# Patient Record
Sex: Female | Born: 1995 | Race: White | Hispanic: No | Marital: Married | Smoking: Never smoker
Health system: Western US, Academic
[De-identification: ages and names within clinical notes are randomized; demographics above are authoritative.]

## PROBLEM LIST (undated history)

## (undated) DIAGNOSIS — H469 Unspecified optic neuritis: Secondary | ICD-10-CM

## (undated) DIAGNOSIS — G35 Multiple sclerosis: Secondary | ICD-10-CM

## (undated) HISTORY — DX: Multiple sclerosis (CMS-HCC): G35

## (undated) HISTORY — DX: Unspecified optic neuritis: H46.9

---

## 2018-10-18 ENCOUNTER — Telehealth (INDEPENDENT_AMBULATORY_CARE_PROVIDER_SITE_OTHER): Payer: Self-pay

## 2018-10-18 NOTE — Telephone Encounter (Signed)
For all patients calling to schedule, reschedule or confirm their appointment    1. Do you have or have you had in the last 24 hours:            Fever:  No           New cough (not chronic) : No           Shortness of breath: No  *Refer patient to PCP if YES for any flu like symptoms           2. Have you knowingly been exposed to anyone having any, some or all of the symptoms listed above?  No     3. Have you traveled  outside of the [place] in the last 14 days? .  No     If Yes to any of these, schedule and or reschedule existing appointment 14 + days out     4.    Are you experiencing any new urgent Neurological symptoms? N/A   If Yes to # 4, provide symptoms below using .sccsymptoms smart phrase and route to site specific triage.    If no to # 4, document encounter and close.  Do not route.    Scripting if patient schedules, reschedules or keeps appointment:   [facility] takes health risks associated with respiratory illnesses, including influenza and the novel coronavirus, very seriously. As such, we are implementing strict visitor restrictions. No visitors of any age will be permitted to visit our hospitals or clinics until further notice. Request for exceptions may be made by contacting the clinic or unit in which loved ones are receiving care.

## 2018-10-21 ENCOUNTER — Encounter (INDEPENDENT_AMBULATORY_CARE_PROVIDER_SITE_OTHER): Payer: Self-pay | Admitting: Hospital

## 2018-10-21 ENCOUNTER — Telehealth (INDEPENDENT_AMBULATORY_CARE_PROVIDER_SITE_OTHER): Payer: Self-pay | Admitting: Neurology

## 2018-10-21 NOTE — Telephone Encounter (Signed)
RN telephoned patient to offer telemedicine visit with Dr. Luiz Blare for patients appointment scheduled 11/03/2018 at 9:45 AM.       Patient is in agreement with Telemedicine appointment. Patient appointment type changed to telemedicine visit.  Patient telemedicine visit instructions sent via MyChart.

## 2018-11-01 ENCOUNTER — Encounter (INDEPENDENT_AMBULATORY_CARE_PROVIDER_SITE_OTHER): Payer: Self-pay | Admitting: Hospital

## 2018-11-03 ENCOUNTER — Telehealth (INDEPENDENT_AMBULATORY_CARE_PROVIDER_SITE_OTHER): Payer: TRICARE Prime—HMO | Admitting: Neurology

## 2018-11-03 ENCOUNTER — Encounter (INDEPENDENT_AMBULATORY_CARE_PROVIDER_SITE_OTHER): Payer: Self-pay

## 2018-11-03 ENCOUNTER — Encounter (INDEPENDENT_AMBULATORY_CARE_PROVIDER_SITE_OTHER): Payer: Self-pay | Admitting: Hospital

## 2018-11-03 ENCOUNTER — Ambulatory Visit (INDEPENDENT_AMBULATORY_CARE_PROVIDER_SITE_OTHER): Payer: TRICARE Prime—HMO | Admitting: Neurology

## 2018-11-03 ENCOUNTER — Encounter (INDEPENDENT_AMBULATORY_CARE_PROVIDER_SITE_OTHER): Payer: Self-pay | Admitting: Neurology

## 2018-11-03 ENCOUNTER — Telehealth (INDEPENDENT_AMBULATORY_CARE_PROVIDER_SITE_OTHER): Payer: Self-pay | Admitting: Neurology

## 2018-11-03 DIAGNOSIS — G35 Multiple sclerosis: Secondary | ICD-10-CM

## 2018-11-03 DIAGNOSIS — H469 Unspecified optic neuritis: Secondary | ICD-10-CM

## 2018-11-03 NOTE — Patient Instructions (Signed)
RECOMMENDATIONS:  1.  Disease modifying therapy or Diagnostic testing:  Considering Copaxone, Tecfidera, Tysabri or Gilenya.    - we will address Lyme concerns; suspect she had a false positive by description; she has appointment with ID June 8th [place]    2.  Symptom management:  None needed    3.  Physical therapy and/or exercise:  Continue regular exercise    4.  Diet and vitamins:    Take daily vitamin D3  2000   IU  Healthy well balanced diet.      5.  Next MRI due:  Please send CDs so Dr. Luiz Blare can review images    6.  Next labs due:  DMT screening labs    7.  Next vision exam due:  OCT and VEP with next visit    8.  Other testing, recommendations, or consults:    9.  Follow-up call in 2 weeks to decide about medication and follow-up in person 2 months    Education Provided: medication management of MS    Resources for support groups and patient education materials can be found at  Www.nationalmssociety.org     MS Disease Modifying Therapies (DMT)  [email or web address]    Wellness in MS  [email or web address]      Research:    Please find below contact information for our MS team.  Please also be aware, since we are with patients in clinic all day, response times may be affected.    For scheduling, changing, or cancelling appointments, please call [phone] or [phone]    For MS medication questions, please contact or Pharmacist, Dr. Barbie Haggis via MyChart or [phone]    For new symptoms, clinical questions, or other nursing related concerns, please contact:         Dorise Bullion, RN for Dr. Luiz Blare via MyChart or [phone]       Anselmo Rod, RN for Dr. Arline Asp via MyChart or [phone]    If unable to reach me directly you may call the Call Center to get a message to our team at  [phone]     To speak with one of our Research Coordinators with questions pertaining to research studies, please contact Annalise Miner or Verlan Friends at [phone]    As a backup plan only, if you have difficulty reaching [place] through the above mechanisms, you may email Dr. Luiz Blare at [email or web address] for non-urgent matters.  Please note that email is not appropriate for urgent matters and MyChart is a preferred, secure messaging system.

## 2018-11-03 NOTE — Progress Notes (Signed)
MS New Patient Note:    ---------------------(data below generated by Raynaldo Opitz, MD)--------------------    Patient Verification & Telemedicine Consent:    I am proceeding with this evaluation at the direct request of the patient.  I have verified this is the correct patient and have obtained verbal consent and written consent from the patient/ surrogate to perform this voluntary telemedicine evaluation (including obtaining history, performing examination and reviewing data provided by the patient).   The patient/ surrogate has the right to refuse this evaluation.  I have explained risks (including potential loss of confidentiality), benefits, alternatives, and the potential need for subsequent face to face care. Patient/ surrogate understands that there is a risk of medical inaccuracies given that our recommendations will be made based on reported data (and we must therefore assume this information is accurate).  Knowing that there is a risk that this information is not reported accurately, and that the telemedicine video, audio, or data feed may be incomplete, the patient agrees to proceed with evaluation and holds [place] harmless knowing these risks. In this evaluation, we will be providing recommendations only. The patient/ surrogate has been notified that other healthcare professionals (including students, residents and Metallurgist) may be involved in this audio-video evaluation.   All laws concerning confidentiality and patient access to medical records and copies of medical records apply to telemedicine.  The patient/ surrogate has received the [place] Notice of Privacy Practices.  I have reviewed this above verification and consent paragraph with the patient/ surrogate.  If the patient is not capacitated to understand the above, and no surrogate is available, since this is not an emergency evaluation, the visit will be rescheduled until such time that the patient can consent, or the  surrogate is available to consent.    Demographics:   Medical Record #: [id number]   Date: Nov 03, 2018   Patient Name: Jasmine Schneider   DOB: [birth year]  Age: 23 year old  Sex: female  Location: Home address on file    Evaluator(s):   Taela Charbonneau was evaluated by me today.    Clinic Location: [place] ([place])  [place]  [place] [address]          Primary care physician:  [facility]  [address]  [place] [address]    Referring physician  Leane Para, MD  [facility] [address]  [place], [place] [address]    I met with Jasmine Schneider in new patient consultation today at the [facility].  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    Her husband is active duty Therapist, art and she was previously seen at [facility].     First symptoms: April 2020 she had retro-orbital pain behind both eyes that then intensified on right side; then woke up with tinted vision in right eye (right ON).  First diagnosed as cluster headaches.  She ultimately saw ophthalmology and was diagnosed with optic neuritis.  On ROS at that visit she reported having tingling episodes in the past in her limbs.  Brain MRI April 30th showed lesions (3-4 bigger and smaller lesions) and she was diagnosed with MS. May 8th C and T spine MRIs done.  She also has a Chiari malformation. She was treated with Solumedrol  for 5 days without taper.      Relapse today:  No  Most recent relapse date: April 2020    Other prior relapses: None known, but had episodes of tingling in the past around menstrual cycle    Current DMT: none, but [facility] hospital doctor was interested in starting [place] but can't provide FDO.   Prior Medications used:    Current MS related symptoms include: tingling around menstrual cycle, pulling sensation in  mid-back    She denies cognitive changes, fatigue, depression, anxiety, visual changes, hearing loss, tinnitus, vertigo, dysarthria/dysphagia, spasticity, weakness, numbness, incoordination, imbalance, falls, tremor, bowel/bladder dysfunction, headache, L'hermitte's sign, Uthoff's phenomenon.     Walking is unrestricted.   She is trying to exercise despite pandemic but not regular.       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 Family history is otherwise notable for  Father has ITP, MGUS and diabetes type 2  Mom has melanoma    Social history: The patient is married and her husband is active duty in Therapist, art and is not deployed.  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  There is no history of drug or alcohol abuse. She  has no history on file for tobacco.    Medications:  Birth control pill    Allergies: Allergies not on file    Physical examination:  There were no vitals taken for this visit.   Video visit exam    Cardiovascular exam: No lower extremity edema.      On neurological examination, the patient is alert and oriented to person, place and time. Cognition is grossly intact with normal language and short-term memory.      Cranial Nerves:   She identified 6/6 color plates correctly with right eye and 6/6 with the left.    Alignment was orthophoric and extraocular movements were intact without nystagmus. Facial sensation was intact to light touch, in all three branches of the trigeminal nerve bilaterally. Facial strength was full and symmetric.  Hearing was grossly intact. The trapezius and sternocleidomastoid strength is full bilaterally. The tongue was midline and rapid alternating movements of the tongue were normal.       Motor Exam:   Pronator Drift: absent   Bulk: normal bulk   Tone: normal tone in all limbs   Fine finger movements were fast and symmetric.   Foot taps were fast and symmetric.       Coordination and Gait:  There is normal finger-nose-finger, heel-knee-shin and rapid alternating movements. There is no dysdiadochokinesia. The gait is normal, narrow-based and with normal heel, toe and tandem walk. Romberg sign was negative.       Imaging: The following imaging studies were reviewed in detail with the patient:   MRI Brain 10/05/18 demonstrates Dawson fingers and infratentorial/cerebellar lesions by report    Cervical Spine MRI in [facility] hospital records report indicates a lesion at C5      IMPRESSION:  As I discussed with the patient, Jasmine Schneider had acute  right ON and imaging consistent with RRMS (by report). We reviewed in detail treatment options for in light of her course thus far, age and the pandemic.       RECOMMENDATIONS:  1.  Disease modifying therapy or Diagnostic testing:  Considering Copaxone, Tecfidera, Tysabri or Gilenya.    - we will address Lyme concerns; suspect she had a false positive by description; she has appointment with ID June 8th [place]    2.  Symptom management:  None needed    3.  Physical therapy and/or exercise:  Continue regular exercise    4.  Diet and vitamins:    Take daily vitamin D3  2000   IU  Healthy well balanced diet.      5.  Next MRI due:  Please send CDs so Dr. Berenice Primas can review images  Repeat MRIs 6 months after starting new medication    6.  Next labs due:  DMT screening labs    7.  Next vision exam due:  OCT and VEP with next visit    8.  Other testing, recommendations, or consults:    9.  Follow-up call in 2 weeks to decide about medication and follow-up in person 2 months    Education Provided:  medication management of MS; covid-19 and MS    Resources for support groups and patient education materials can be found at  [email or web address]     MS Disease Modifying Therapies (DMT)  [email or web address]    Wellness in [place]  [email or web address]      Research:    Please find below contact information for our MS team.  Please also be aware, since we are with patients in clinic all day, response times may be affected.    For scheduling, changing, or cancelling appointments, please call [phone] or [phone]    For MS medication questions, please contact or Pharmacist, Dr. Bosie Clos via [place] or [phone]    For new symptoms, clinical questions, or other nursing related concerns, please contact:         Prudencio Pair, RN for Dr. Berenice Primas via MyChart or [phone]       Sheran Lawless, RN for Dr. Rosendo Gros via [place] or [phone]    If unable to reach me directly you may call the Call Center to get a message to our team at [phone]     To speak with one of our Research Coordinators with questions pertaining to research studies, please contact [place] or Alda Berthold at [phone]    As a backup plan only, if you have difficulty reaching [place] through the above mechanisms, you may email Dr. Berenice Primas at [email or web address] for non-urgent matters.  Please note that email is not appropriate for urgent matters and MyChart is a preferred, secure messaging system.        This encounter included time spent in the following categories:    Pre-visit: 10 minutes reviewing scanned documents from the [facility], Care Everywhere, and recent diagnostic tests, MRI reports.  Intra-Visit: 45 minutes updating relevant history, performing a video-based physical exam, making a diagnosis, creating and  discussing a treatment plan.  and  Post-Visit: 15 minutes completing the note, placing orders, and coordinating care.  The total duration of this encounter including the Pre-Visit, Intra-Visit, and Post-Visit times, excluding separately reportable services/procedures was:  70 minutes

## 2018-11-03 NOTE — Telephone Encounter (Signed)
Placed a call to pt to schedule for a 2 weeks f/u appt as recommended in today's  [facility] appt  however, provider's schedule is blocked first 2 weeks of June. In the meantime, pt was scheduled at the earliest appt date found which is 12/01/18 @ 1:15 PM via [facility] appt. Also pt stated she will mail CD to the clinic re: imaging results. Provided pt clinic address.

## 2018-11-07 ENCOUNTER — Telehealth (INDEPENDENT_AMBULATORY_CARE_PROVIDER_SITE_OTHER): Payer: Self-pay | Admitting: Neurology

## 2018-11-07 NOTE — Telephone Encounter (Signed)
RN telephoned patient to follow up p[er Dr. Luiz Blare request below about DMT.  Patient stated "she didn't want to appear rude or offensive, but Dr. Luiz Blare hasn't even seen my MRI's and rest of my testing yet. I'm following up with Infectious Disease June 8th re: my Lyme Disease for additional testing also".  Patient told RN that "until she is 100% certain she has MS she does not want to be on a DMT. She doesn't want to medication unless she has to and this is a big decision".      When asked about imaging, patient stated she would bring in medical records and imaging discs to clinic, wait for them to be uploaded and then take them home with her after her visit with ID.      RN will convey message to Dr. Christian Mate, Alden Server, MD  Adriana Simas, RN            Video on 25th fine for me, but maybe ask patient is she had made decision about DMT meds or had a change to talk to [facility].    Previous Messages      ----- Message -----   From: Adriana Simas, RN   Sent: 11/07/2018 11:05 AM PDT   To: Briscoe Burns, MD   Subject: Telephone F/Up in 2 weeks               Patient has return video visit 6/25. Do you also want me to schedule a phone consult?     ----- Message -----   From: Briscoe Burns, MD   Sent: 11/03/2018 10:42 AM PDT   To: Adriana Simas, RN     I will need assistance scheduling this patient for a telephone follow-up in 2 weeks. We will try to use new "office hours" to ease scheduling. Thanks

## 2018-11-14 ENCOUNTER — Other Ambulatory Visit: Payer: Self-pay

## 2018-11-14 ENCOUNTER — Telehealth (INDEPENDENT_AMBULATORY_CARE_PROVIDER_SITE_OTHER): Payer: Self-pay | Admitting: Neurology

## 2018-11-14 NOTE — Telephone Encounter (Signed)
CD has been successfully uploaded via [place] CD will be mailed back to patient at address on file

## 2018-11-14 NOTE — Telephone Encounter (Signed)
Hello,    Pt stopped by at [facility] to drop off disc for upload; All images and reports from [facility]. Disc placed in blue folder.    Pls mail back to pt's address (confirmed)    Thanks,

## 2018-11-28 ENCOUNTER — Telehealth (INDEPENDENT_AMBULATORY_CARE_PROVIDER_SITE_OTHER): Payer: Self-pay | Admitting: Neurology

## 2018-11-28 NOTE — Telephone Encounter (Signed)
Placed a call to pt, reminder her of upcoming [place] appt on 12/01/18 @ 1:15 PM with Dr. Berenice Primas. Reviewed AVS recommendations from [place] on 11/03/18, per pt, she has no questions or concerns at this time.

## 2018-11-30 ENCOUNTER — Encounter (INDEPENDENT_AMBULATORY_CARE_PROVIDER_SITE_OTHER): Payer: Self-pay | Admitting: Hospital

## 2018-11-30 ENCOUNTER — Encounter (HOSPITAL_BASED_OUTPATIENT_CLINIC_OR_DEPARTMENT_OTHER): Payer: Self-pay | Admitting: Hospital

## 2018-12-01 ENCOUNTER — Telehealth (INDEPENDENT_AMBULATORY_CARE_PROVIDER_SITE_OTHER): Payer: Self-pay | Admitting: Neurology

## 2018-12-01 ENCOUNTER — Telehealth (INDEPENDENT_AMBULATORY_CARE_PROVIDER_SITE_OTHER): Payer: TRICARE Prime—HMO | Admitting: Neurology

## 2018-12-01 ENCOUNTER — Encounter (INDEPENDENT_AMBULATORY_CARE_PROVIDER_SITE_OTHER): Payer: Self-pay | Admitting: Neurology

## 2018-12-01 DIAGNOSIS — G35 Multiple sclerosis: Secondary | ICD-10-CM

## 2018-12-01 DIAGNOSIS — R5383 Other fatigue: Secondary | ICD-10-CM

## 2018-12-01 MED ORDER — AMANTADINE HCL 100 MG OR TABS
100.0000 mg | ORAL_TABLET | Freq: Two times a day (BID) | ORAL | 0 refills | Status: DC
Start: 2018-12-01 — End: 2019-01-26

## 2018-12-01 NOTE — Telephone Encounter (Signed)
Placed a call to pt, reviewed today's AVS recommendations. Per pt, she has no questions at this time.    Additionally, I scheduled pt's recommended 3 months in-person visit with Dr. Berenice Primas on 03/09/19 at 11:30 AM. Appt reminder printed and will mail to address on file, verified by pt.

## 2018-12-01 NOTE — Progress Notes (Signed)
MS Follow-up Note:  LV 11/03/18    ---------------------(data below generated by Jasmine Opitz, MD)--------------------    Patient Verification & Telemedicine Consent:    I am proceeding with this evaluation at the direct request of the patient.  I have verified this is the correct patient and have obtained verbal consent and written consent from the patient/ surrogate to perform this voluntary telemedicine evaluation (including obtaining history, performing examination and reviewing data provided by the patient).   The patient/ surrogate has the right to refuse this evaluation.  I have explained risks (including potential loss of confidentiality), benefits, alternatives, and the potential need for subsequent face to face care. Patient/ surrogate understands that there is a risk of medical inaccuracies given that our recommendations will be made based on reported data (and we must therefore assume this information is accurate).  Knowing that there is a risk that this information is not reported accurately, and that the telemedicine video, audio, or data feed may be incomplete, the patient agrees to proceed with evaluation and holds [place] harmless knowing these risks. In this evaluation, we will be providing recommendations only. The patient/ surrogate has been notified that other healthcare professionals (including students, residents and Metallurgist) may be involved in this audio-video evaluation.   All laws concerning confidentiality and patient access to medical records and copies of medical records apply to telemedicine.  The patient/ surrogate has received the [place] Notice of Privacy Practices.  I have reviewed this above verification and consent paragraph with the patient/ surrogate.  If the patient is not capacitated to understand the above, and no surrogate is available, since this is not an emergency evaluation, the visit will be rescheduled until such time that the patient can consent, or  the surrogate is available to consent.    Demographics:   Medical Record #: [id number]    Date: December 01, 2018   Patient Name: Jasmine Schneider   DOB: 1996/04/18  Age: 23 year old  Sex: female  Location: Home address on file    Evaluator(s):   Jasmine Schneider was evaluated by me today.    Clinic Location: [place] ([place])  [place]  [place] [address]      Primary care physician:  Jasmine Schneider  [address]  [place] [address]    Referring physician  Jasmine Para, MD  [facility] [address]  [place], [place] [address]    I met with Jasmine Schneider in new patient consultation today at the [facility].  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    Her husband is active duty Therapist, art and she was previously seen at [facility].     Interval history:  - Repeat Lyme test was negative  - Goal for today is to pick MS medication and address fatigue - napping two hours a day and sleeping long overnight as well.   - Planning a pregnancy in 3 years      First symptoms: April 2020 she had retro-orbital pain behind both eyes that then intensified on right side; then woke up with tinted vision in right eye (right ON).  First diagnosed as cluster headaches.  She ultimately saw ophthalmology and was diagnosed with optic neuritis.  On ROS at that visit she reported  having tingling episodes in the past in her limbs.  Brain MRI April 30th showed lesions (3-4 bigger and smaller lesions) and she was diagnosed with MS. May 8th C and T spine MRIs done.  She also has a Chiari malformation. She was treated with Solumedrol for 5 days without taper.      Relapse today:  No  Most recent relapse date: April 2020    Other prior relapses: None known, but had episodes of tingling in the past around menstrual cycle    Current DMT:  none, but [facility] Schneider doctor was interested in starting [place] but can't provide FDO.   Prior Medications used:    Current MS related symptoms include: tingling around menstrual cycle, pulling sensation in mid-back    She denies cognitive changes, fatigue, depression, anxiety, visual changes, hearing loss, tinnitus, vertigo, dysarthria/dysphagia, spasticity, weakness, numbness, incoordination, imbalance, falls, tremor, bowel/bladder dysfunction, headache, L'hermitte's sign, Uthoff's phenomenon.     Walking is unrestricted.   She is trying to exercise despite pandemic but not regular.       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 Family history is otherwise notable for  Father has ITP, MGUS and diabetes type 2  Mom has melanoma    Social history: The patient is married and her husband is active duty in Therapist, art and is not deployed.  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  There is no history of drug or alcohol abuse. She  reports that she has never smoked. She has never used smokeless tobacco.    Medications:  Birth control pill    Allergies: Not on File    Physical examination:  There were no vitals taken for this visit.   Video visit exam    Cardiovascular exam: No lower extremity edema.      On neurological examination, the patient is alert and oriented to person, place and time. Cognition is grossly intact with normal language and short-term memory.      Cranial Nerves:   She identified 6/6 color plates correctly with right eye and 6/6 with the  left.    Alignment was orthophoric and extraocular movements were intact without nystagmus. Facial sensation was intact to light touch, in all three branches of the trigeminal nerve bilaterally. Facial Schneider was full and symmetric. Hearing was grossly intact. The trapezius and sternocleidomastoid Schneider is full bilaterally. The tongue was midline and rapid alternating movements of the tongue were normal.       Motor Exam:   Pronator Drift: absent   Bulk: normal bulk   Tone: normal tone in all limbs   Fine finger movements were fast and symmetric.   Foot taps were fast and symmetric.       Coordination and Gait:  There is normal finger-nose-finger, heel-knee-shin and rapid alternating movements. There is no dysdiadochokinesia. The gait is normal, narrow-based and with normal heel, toe and tandem walk. Romberg sign was negative.       Imaging: The following imaging studies were reviewed  in detail with the patient (images available for my review today)  MRI Brain 10/05/18 demonstrates Dawson fingers and infratentorial/cerebellar lesions by report and my own review today. >10 lesions consistent in appearance and location with MS    Cervical Spine and Thoracic MRI in [facility] records report indicates a lesion at C5; by my review also lesion C2-4 and likely partial cord lesion mid-thoracic cord.           IMPRESSION:  As I discussed with the patient, Ms. Linskey had acute right ON and imaging consistent with RRMS (by report). We reviewed in detail treatment options for in light of her course thus far, age and the pandemic. We reviewed several medications but she favors the S1P drugs.        RECOMMENDATIONS:  1.  Disease modifying therapy or Diagnostic testing:  S1P receptor drug - fingolimod, siponimod or ozanimod   She will review pros and cons with pharmacist and will also determine what is compatible with her insurance.     2.  Symptom management:  Amantadine 100 mg twice daily for fatigue - needs assistance  getting script to [facility]    3.  Physical therapy and/or exercise:  Continue regular exercise    4.  Diet and vitamins:    Take daily vitamin D3  2000   IU  Healthy well balanced diet.      5.  Next MRI due:  Repeat MRIs 6 months after starting new medication    6.  Next labs due:  1 month after starting drug    7.  Next vision exam due:  OCT and VEP with next visit    8.  Other testing, recommendations, or consults:    9.  Follow-up in person in 3 months    Education Provided: medication management of MS; covid-19 and MS    Resources for support groups and patient education materials can be found at  [email or web address]     MS Disease Modifying Therapies (DMT)  [email or web address]    Wellness in [place]  [email or web address]      Research:    Please find below contact information for our MS team.  Please also be aware, since we are with patients in clinic all day, response times may be affected.    For scheduling, changing, or cancelling appointments, please call [phone] or [phone]    For MS medication questions, please contact or Pharmacist, Dr. Bosie Clos via [place] or [phone]    For new symptoms, clinical questions, or other nursing related concerns, please contact:         Prudencio Pair, RN for Dr. Berenice Primas via MyChart or [phone]       Sheran Lawless, RN for Dr. Rosendo Gros via [place] or [phone]    If unable to reach me directly you may call the Call Center to get a message to our team at [phone]     To speak with one of our Research Coordinators with questions pertaining to research studies, please contact [place] or Alda Berthold at [phone]    As a backup plan only, if you have difficulty reaching [place] through the above mechanisms, you may email Dr. Berenice Primas at  [email or web address] for non-urgent matters.  Please note that email is not appropriate for urgent matters and MyChart is a preferred, secure messaging system.        This encounter included time spent in the following categories:    Pre-visit: 10 minutes reviewing uploaded MRI images and notes from  last visit  Intra-Visit: 30 minutes updating relevant history, performing a video-based physical exam, making a diagnosis, creating and discussing a treatment plan.  and  Post-Visit: 10 minutes completing the note, placing orders, and coordinating care.  The total duration of this encounter including the Pre-Visit, Intra-Visit, and Post-Visit times, excluding separately reportable services/procedures was:  50 minutes

## 2018-12-01 NOTE — Telephone Encounter (Signed)
RN faxed Amantadine order to [facility] per Dr. Berenice Primas request.    Received: Today   Message Contents   Berenice Primas Brantley Stage, MD  Alvino Blood, RN            Please help patient get amantadine script to [facility] - I can't eprescribe there

## 2018-12-02 ENCOUNTER — Telehealth (INDEPENDENT_AMBULATORY_CARE_PROVIDER_SITE_OTHER): Payer: Self-pay | Admitting: Student in an Organized Health Care Education/Training Program

## 2018-12-02 DIAGNOSIS — G35 Multiple sclerosis: Secondary | ICD-10-CM

## 2018-12-02 MED ORDER — FINGOLIMOD HCL 0.5 MG PO CAPS
0.5000 mg | ORAL_CAPSULE | Freq: Every day | ORAL | 5 refills | Status: DC
Start: 2018-12-02 — End: 2019-01-26
  Filled 2018-12-02 – 2019-01-25 (×2): qty 30, 30d supply, fill #0

## 2018-12-02 NOTE — Telephone Encounter (Signed)
[facility] Initial Prior Authorization Request    Discussed various S1P options for treatment of MS with Jasmine Schneider and she would like to pursue Gilenya.    Baseline:  (within 6 months)  CBC wnl 10/05/18  Varicella Zoster IGG Ab- positive 10/14/18  LFTs wnl 10/05/18  OCT- reviewed outside labs "slight paracentral thinning OU, symmetric" 10/05/18  EKG- states recently done at [facility], asking her to have actual report faxed to office for review        PLAN:  --Will await EKG report and then send in start form  --Will request Gilenya at home via manufacturer for FDO  --Order sent to [place] to work on authorization

## 2018-12-05 ENCOUNTER — Other Ambulatory Visit: Payer: Self-pay

## 2018-12-05 NOTE — Telephone Encounter (Signed)
[facility] Initial Prior Authorization Request    Received notification that patient requires prior authorization for Gilenya 0.5mg  once daily, #30/30 days     Insurance ID: [id number]     PLAN:  --Submitted PA to [facility] via fax

## 2018-12-06 ENCOUNTER — Other Ambulatory Visit: Payer: Self-pay

## 2018-12-06 NOTE — Telephone Encounter (Signed)
[facility] Prior Authorization Status     PA for Gilenya 0.5mg  once daily, #30/30 days    Approved        Dates: Effective unitl 06/07/2098    PA #: [id number]    Specialty pharmacy: [facility]    PLAN:  --Prescription needs to be entered, routed to [facility]

## 2018-12-07 NOTE — Telephone Encounter (Signed)
Notified Jasmine Schneider of approval.    Awaiting EKG from [facility].- She will follow up with them again.    Will send in start form once confirmed.    Sent instructions on how she can e-consent for services [email or web address]

## 2018-12-12 ENCOUNTER — Other Ambulatory Visit: Payer: Self-pay

## 2018-12-20 ENCOUNTER — Encounter (INDEPENDENT_AMBULATORY_CARE_PROVIDER_SITE_OTHER): Payer: Self-pay | Admitting: Neurology

## 2018-12-20 NOTE — Telephone Encounter (Signed)
Error, please disregard.

## 2018-12-23 NOTE — Telephone Encounter (Signed)
Patient was told she has to make a formal records request, which she did.     [place] at [phone]    Number for Neurology: [phone]. Dr. Shawn Stall is out for the next 14 days but     "normal sinus, borderline right axis deviations"    Per Dr. Berenice Primas, we will need to see actual ECG or note from Dr. Shawn Stall or cardiologist that states it was a normal ECG (as this sounds like the machine read)    Notified [place] of status via [place]

## 2018-12-30 ENCOUNTER — Telehealth (INDEPENDENT_AMBULATORY_CARE_PROVIDER_SITE_OTHER): Payer: Self-pay | Admitting: Neurology

## 2018-12-30 NOTE — Telephone Encounter (Signed)
EKG scanned into media

## 2019-01-03 ENCOUNTER — Encounter (INDEPENDENT_AMBULATORY_CARE_PROVIDER_SITE_OTHER): Payer: Self-pay | Admitting: Student in an Organized Health Care Education/Training Program

## 2019-01-03 DIAGNOSIS — R5383 Other fatigue: Secondary | ICD-10-CM

## 2019-01-03 DIAGNOSIS — G35 Multiple sclerosis: Secondary | ICD-10-CM

## 2019-01-04 ENCOUNTER — Telehealth (INDEPENDENT_AMBULATORY_CARE_PROVIDER_SITE_OTHER): Payer: Self-pay | Admitting: Neurology

## 2019-01-04 NOTE — Telephone Encounter (Signed)
Received enrollment form confirmation via fax from [place] scanned to media.    Routing as FYI.

## 2019-01-25 ENCOUNTER — Other Ambulatory Visit: Payer: Self-pay

## 2019-01-25 ENCOUNTER — Telehealth (INDEPENDENT_AMBULATORY_CARE_PROVIDER_SITE_OTHER): Payer: Self-pay | Admitting: Neurology

## 2019-01-25 DIAGNOSIS — G35 Multiple sclerosis: Secondary | ICD-10-CM

## 2019-01-25 NOTE — Telephone Encounter (Signed)
Received reports via fax from [facility] regarding pt's [place] treatment report and pre/post EKG report.    Documents scanned into Environmental health practitioner.

## 2019-01-26 ENCOUNTER — Encounter (HOSPITAL_BASED_OUTPATIENT_CLINIC_OR_DEPARTMENT_OTHER): Payer: Self-pay | Admitting: Hospital

## 2019-01-26 ENCOUNTER — Encounter (INDEPENDENT_AMBULATORY_CARE_PROVIDER_SITE_OTHER): Payer: Self-pay | Admitting: Neurology

## 2019-01-26 MED ORDER — GILENYA 0.5 MG PO CAPS
0.50 mg | ORAL_CAPSULE | Freq: Every day | ORAL | 5 refills | Status: DC
Start: 2019-01-26 — End: 2020-01-16

## 2019-01-26 MED ORDER — AMANTADINE HCL 100 MG OR TABS
100.00 mg | ORAL_TABLET | Freq: Two times a day (BID) | ORAL | 0 refills | Status: AC
Start: 2019-01-26 — End: ?

## 2019-01-26 NOTE — Telephone Encounter (Signed)
From: Jasmine Schneider  To: Raynaldo Opitz, MD  Sent: 01/26/2019 3:08 PM PDT  Subject: 1-Non Urgent Medical Advice    Hello, was wondering if you will do a JC virus test on me, or if that's something that my neurologist would do! thanks   Jasmine Schneider

## 2019-01-26 NOTE — Telephone Encounter (Signed)
FDO completed on 01/21/19.  FDO report scanned into media.  FDO report reviewed - NSR (pre/post), HR 63 (pre)/66 (post), QT 390 (pre)/382(post), no AV block (pre/post).  No symptoms reported during FDO and HR/BP stable throughout.     Spoke to patient, she reports tolerating the FDO well.   She did experience 2 episodes of "fluttering" in her chest that lasted only a few seconds and has not had any more since.  She did not use any pre-workout, have any energy drinks, caffeine, etc. Denied any other symptoms including: shortness of breath, chest pain, lightheadedness, dizziness.      [place] she will bring up the above side effects to him.  Will also route to Dr. Berenice Primas for awareness.      Patient will continue Gilenya and report any additional "flutters" or any new side effects.  Rx entered and sent to [facility].     Of note, patient reports taking pre-workout.  She will send [place] a picture of the ingredients so that we can make sure there aren't any interactions with her Gilenya.      Erik Obey Momodou Consiglio, PHARMD, BCPS, NCTTP

## 2019-02-06 NOTE — Telephone Encounter (Signed)
Patient indicated [place] lab completed and is awaiting results. RN requested office receive a faxed or mail in copy of results from patient.

## 2019-02-07 NOTE — Telephone Encounter (Signed)
Researched ingredients in preworkout.  I cannot find information on everything.  From what I was able to find, there are 2 ingredients that cause the most concern:  evodiamine and rauwolfia vomitoria.  Evodiamine can cause QT prolongation and Rauwolfia vomitoria can cause bradycardia and irregular heartbeats.   Because Gilenya also has the potential to do this, we would be worried about additive effects of the combination.      Because I am unable to find information on all the ingredients and with the 2 concerning ingredients above, I would recommend against using this pre-workout product.      Patient notified via [place] in separate encounter.    Unable to find adequate information on the following:   - Pump and Volume blend (no ingredients listed)  - Citruline  - Energy blend (no ingredients listed)  - Focus and absorption energy blend (no ingredients listed)  - DMAE    Eugen Jeansonne L Sabrea Sankey, PHARMD, BCPS, NCTTP

## 2019-02-16 ENCOUNTER — Telehealth (INDEPENDENT_AMBULATORY_CARE_PROVIDER_SITE_OTHER): Payer: Self-pay | Admitting: Neurology

## 2019-02-16 NOTE — Telephone Encounter (Signed)
Placed a call back to Dr. Macario Golds office (Neurologist) to provide requested clinic fax number: [phone] to fax mutual pt's lab results.

## 2019-02-17 ENCOUNTER — Encounter (HOSPITAL_BASED_OUTPATIENT_CLINIC_OR_DEPARTMENT_OTHER): Payer: Self-pay | Admitting: Student in an Organized Health Care Education/Training Program

## 2019-02-17 NOTE — Telephone Encounter (Signed)
Called Dr. Macario Golds office at [facility] [phone] (option 5 for neurology)    They state they faxed yesterday. Not scanned in yet but seems it was not the correct test.     Will discuss with Dr. Shawn Stall

## 2019-02-17 NOTE — Telephone Encounter (Signed)
Spoke to Dr, Shawn Stall. They did the JCV antibody test and not the index test. Provided info on the one that is needed: JCV Antibody index. The [facility] test code is [phone]. (full test name STRATIFY JCV Antibody (with Index) with Reflex to Inhibition Assay)    He ordered this test. I notified Anayansi of the  Need for repeat.

## 2019-02-17 NOTE — Telephone Encounter (Signed)
Received JCV Antibody test via fax from Dr. Macario Golds office.    Document scanned into media manager available for MD to  review

## 2019-02-17 NOTE — Telephone Encounter (Signed)
On review, this was the correct test.     I notified Dr. Shawn Stall and the patient, no need to repeat.     JCV Ab index is 0.16 (negative)

## 2019-03-07 ENCOUNTER — Encounter (HOSPITAL_BASED_OUTPATIENT_CLINIC_OR_DEPARTMENT_OTHER): Payer: Self-pay | Admitting: Hospital

## 2019-03-09 ENCOUNTER — Other Ambulatory Visit (INDEPENDENT_AMBULATORY_CARE_PROVIDER_SITE_OTHER): Payer: TRICARE Prime—HMO | Attending: Neurology

## 2019-03-09 ENCOUNTER — Encounter (INDEPENDENT_AMBULATORY_CARE_PROVIDER_SITE_OTHER): Payer: Self-pay | Admitting: Neurology

## 2019-03-09 ENCOUNTER — Ambulatory Visit (INDEPENDENT_AMBULATORY_CARE_PROVIDER_SITE_OTHER): Payer: TRICARE Prime—HMO | Admitting: Neurology

## 2019-03-09 ENCOUNTER — Telehealth (INDEPENDENT_AMBULATORY_CARE_PROVIDER_SITE_OTHER): Payer: Self-pay | Admitting: Neurology

## 2019-03-09 VITALS — BP 138/78 | HR 78 | Temp 97.2°F | Resp 18 | Ht 63.0 in | Wt 205.0 lb

## 2019-03-09 DIAGNOSIS — G35 Multiple sclerosis: Secondary | ICD-10-CM | POA: Insufficient documentation

## 2019-03-09 LAB — CBC WITH DIFF, BLOOD
ANC-Automated: 4 10*3/uL (ref 1.6–7.0)
Abs Basophils: 0 10*3/uL
Abs Eosinophils: 0.1 10*3/uL (ref 0.0–0.5)
Abs Lymphs: 0.4 10*3/uL — ABNORMAL LOW (ref 0.8–3.1)
Abs Monos: 0.6 10*3/uL (ref 0.2–0.8)
Basophils: 0 %
Eosinophils: 1 %
Hct: 40.7 % (ref 34.0–45.0)
Hgb: 13.2 gm/dL (ref 11.2–15.7)
Lymphocytes: 9 %
MCH: 29.6 pg (ref 26.0–32.0)
MCHC: 32.4 g/dL (ref 32.0–36.0)
MCV: 91.3 um3 (ref 79.0–95.0)
MPV: 9.7 fL (ref 9.4–12.4)
Monocytes: 12 %
Plt Count: 274 10*3/uL (ref 140–370)
RBC: 4.46 10*6/uL (ref 3.90–5.20)
RDW: 12.6 % (ref 12.0–14.0)
Segs: 78 %
WBC: 5.1 10*3/uL (ref 4.0–10.0)

## 2019-03-09 LAB — LIVER PANEL, BLOOD
ALT (SGPT): 35 U/L — ABNORMAL HIGH (ref 0–33)
AST (SGOT): 25 U/L (ref 0–32)
Albumin: 4.5 g/dL (ref 3.5–5.2)
Alkaline Phos: 57 U/L (ref 35–140)
Bilirubin, Dir: <0.2 mg/dL (ref <0.2)
Bilirubin, Tot: 0.29 mg/dL (ref <1.2)
Total Protein: 6.9 g/dL (ref 6.0–8.0)

## 2019-03-09 NOTE — Progress Notes (Signed)
MS Follow-up Note:    LV 12/01/18 Video   Today's visit 03/09/2019 in-person    Primary care physician:  [facility], [facility]  [address]  [place] [address]    Referring physician  Leane Para, MD  [facility] [address]  [place], [place] [address]    I met with Ms. Jasmine Schneider in follow-up consultation today at the [facility].  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     Her husband is active duty Therapist, art and she is also followed at the [facility].     Interval history:  Started Gilenya 1.5 months ago.  No issues with first dose nor any side effects.  Some improvement in fatigue with change in diet and doesn't think amantadine will help more than diet.     Pregnancy:  - Planning a pregnancy in 3 years   - Education given: must use contraception with Gilenya      Prior history:  First symptoms: April 2020 she had retro-orbital pain behind both eyes that then intensified on right side; then woke up with tinted vision in right eye (right ON).  First diagnosed as cluster headaches.  She ultimately saw ophthalmology and was diagnosed with optic neuritis.  On ROS at that visit she reported having tingling episodes in the past in her limbs.  Brain MRI April 30th showed lesions (3-4 bigger and smaller lesions) and she was diagnosed with MS. May 8th C and T spine MRIs done.  She also has a Chiari malformation. She was treated with Solumedrol for 5 days without taper.      Relapse today:  No  Most recent relapse date: April 2020    Other prior relapses:   - 4 years ago had episode of bilateral leg tingling  - between that episode and her ON she also had a episode of left arm sensory changes     Current DMT: Gilenya   Prior Medications used:    Current MS related symptoms include: tingling around menstrual cycle, fatigue    She denies cognitive changes,  depression, anxiety, current visual changes, hearing loss, tinnitus, vertigo, dysarthria/dysphagia, spasticity, weakness, numbness, incoordination, imbalance, falls, tremor, bowel/bladder dysfunction, headache, L'hermitte's sign, Uthoff's phenomenon.     Walking is unrestricted.   She is trying to exercise despite pandemic [place]      Other notable symptoms include: none    A 14 point review of systems is unchanged, which I reviewed and was otherwise remarkable for: no CV-19 symptoms; spondylosis in L5    Review of Previous Records:  [facility] ophthlamology:  HVF - paracentral visual field deficit OS  MRI Brain 10/05/18 demonstrates Dawson fingers and infratentorial/cerebellar lesions by report    Past Medical History is notable for   New diagnosis of MS    Family history: There is no family history of multiple sclerosis. Family history is otherwise notable for  Father has ITP, MGUS and diabetes type 2  Mom has melanoma    Social history: The patient is married and her husband is active duty in Therapist, art and is not deployed.  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  There is no history of drug or alcohol abuse. She  reports that she has never  smoked. She has never used smokeless tobacco.    Medications:  Birth control pill    Allergies: No Known Allergies    Physical examination:  BP 138/78 (BP Location: Right arm, BP Patient Position: Sitting, BP cuff size: Regular)   Pulse 78   Temp 97.2 F (36.2 C) (Temporal)   Resp 18   Ht _0  (1.6 m)   Wt 93 kg (205 lb)   SpO2 98%   BMI 36.31 kg/m      Physical examination:    Cardiovascular exam: No lower extremity edema.  Pedal pulses present bilaterally.    On neurological examination, the patient is alert and oriented to person, place and time. Cognition is grossly intact with normal language and short-term memory.      Cranial Nerves:  Snellen acuity equivalent to near card was 20/20 bilaterally. She identified 6/6  color plates correctly with right eye and 6/6 with the left.  Pupils are equal round and reactive to light, with no afferent pupillary defect. Undilated fundus exam was notable for very subtle temporal rim pallor. Visual fields were full to confrontation.  Alignment was orthophoric and extraocular movements were intact without nystagmus. Facial sensation was intact to light touch, pinprick and temperature in all three branches of the trigeminal nerve bilaterally. Facial strength was full and symmetric. Hearing was grossly intact. The palate elevated evenly. The trapezius and sternocleidomastoid strength is full bilaterally. The tongue was midline and rapid alternating movements of the tongue were normal.       Motor Exam:   Pronator Drift: absent   Bulk: normal bulk   Tone: normal tone in all limbs   Fine finger movements were fast and symmetric.   Foot taps were fast and symmetric.   Power: Strength full (5/5) to confrontation with the following exceptions: None    Reflexes:    Right  Left    Biceps  2+  2+    Triceps  2+  2+    Brachioradialis  2+ 2+   Knee  2+  2+    Ankle  2+  2+    Plantar  flexor  flexor      Sensory Exam:   Light Touch  Normal    Pain  Normal    Temperature  Normal    Vibration  Normal    Proprioception  Normal        Coordination and Gait:  There is normal finger-nose-finger, heel-knee-shin and rapid alternating movements. There is no dysdiadochokinesia. The gait is normal, narrow-based and with normal heel, toe and tandem walk. Romberg sign was negative.         The EDSS score was 1 with the following functional scale scores: visual 1, brainstem 0, pyramidal 0, sensory 0, cerebellar 0, bowel and bladder 0, cerebral 0.      9-Hole PEG Test 03/09/2019   RUE  17.88   RUE (Best) 17.25   LUE 21.38   LUE (Best) 19.46       25 Ft. Ambulation Time 03/09/2019   25 ft Ambulation Time - 1st Trial (Seconds) 4.16   Independently or With Assistance? Independently   25 ft Ambulation Time - 2nd Trial  (Seconds) 3.83   Independently or With Assistance? Independently       No flowsheet data found.    No flowsheet data found.    No flowsheet data found.    No flowsheet data found.      Imaging: The following imaging studies were  reviewed in detail with the patient (images available for my review today)  MRI Brain 10/05/18 demonstrates Dawson fingers and infratentorial/cerebellar lesions by report and my own review today. >10 lesions consistent in appearance and location with MS    Cervical Spine and Thoracic MRI in [facility] hospital records report indicates a lesion at C5; by my review also lesion C2-4 and likely partial cord lesion mid-thoracic cord.           IMPRESSION:  As I discussed with the patient, Ms. Spies had acute right ON April 2020. Likely prior sensory relapses and her imaging is consistent with RRMS (by report).  She has done well with first 6 weeks of Gilenya.  No plans for immediate pregnancy.        RECOMMENDATIONS:  1.  Disease modifying therapy or Diagnostic testing:  Gilenya    2.  Symptom management:  Does not need to take amantadine for fatigue.  Can continue to try diet and exercise    3.  Physical therapy and/or exercise:  Continue regular exercise    4.  Diet and vitamins:    Take daily vitamin D3  2000   IU  Healthy well balanced diet.      5.  Next MRI due:  Repeat MRI Brain this fall - ordered here but do through the [facility] as well.     6.  Next labs due:  Today    7.  Next vision exam due:  OCT  with next visit    8.  Other testing, recommendations, or consults:    9.  Follow-up in person or video  in 3 months    Education Provided: medication management of MS; covid-19 and MS    Resources for support groups and patient education materials can be found at  [place].org     MS Disease Modifying Therapies (DMT)  [email or web address]    Wellness in  [place]  [email or web address]      Research:    Please find below contact information for our MS team.  Please also be aware, since we are with patients in clinic all day, response times may be affected.    For scheduling, changing, or cancelling appointments, please call [phone] or [phone]    For MS medication questions, please contact or Pharmacist, Dr. Bosie Clos via [place] or [phone]    For new symptoms, clinical questions, or other nursing related concerns, please contact:         Prudencio Pair, RN for Dr. Berenice Primas via MyChart or [phone]       Sheran Lawless, RN for Dr. Rosendo Gros via [place] or [phone]    If unable to reach me directly you may call the Call Center to get a message to our team at [phone]     To speak with one of our Research Coordinators with questions pertaining to research studies, please contact [place] or Alda Berthold at [phone]    As a backup plan only, if you have difficulty reaching [place] through the above mechanisms, you may email Dr. Berenice Primas at [email or web address] for non-urgent matters.  Please note that email is not appropriate for urgent matters and MyChart is a preferred, secure messaging system.    Today's visit was 40 minutes in duration face to face , 25 minutes of which was spent counseling the patient with regard to symptoms, diagnosis, prognosis and therapeutic options.

## 2019-03-09 NOTE — Telephone Encounter (Signed)
Orders signed.     Trennon Torbeck L Aryn Kops, PHARMD, BCPS, NCTTP

## 2019-03-09 NOTE — Patient Instructions (Addendum)
Plan:  -Vaughn to stop amantadine  - Continue new diet and exercise  -MRI Brain in Oct-Nov for new baseline - here or [place]  - OCT in 3-5 months    Follow-up in 3 months    Please find below contact information for our MS team.  Please also be aware, since we are with patients in clinic all day, response times may be affected.    For scheduling, changing, or cancelling appointments, please call [phone] or [phone]    For MS medication questions, please contact or Pharmacist, Dr. Bosie Clos via [place] or [phone]    For new symptoms, clinical questions, or other nursing related concerns, please contact:         Prudencio Pair, RN for Dr. Berenice Primas via MyChart or [phone]       Sheran Lawless, RN for Dr. Rosendo Gros via [place] or [phone]    If unable to reach me directly you may call the Call Center to get a message to our team at [phone]     To speak with one of our Research Coordinators with questions pertaining to research studies, please contact [place] or Alda Berthold at [phone]    As a backup plan only, if you have difficulty reaching [place] through the above mechanisms, you may email Dr. Berenice Primas at [email or web address] for non-urgent matters.  Please note that email is not appropriate for urgent matters and MyChart is a preferred, secure messaging system.    We hope that this will help to clarify some of the confusion and allow for better communication in the future.

## 2019-03-09 NOTE — Telephone Encounter (Signed)
RN spoke with patient in clinic setting and patient informed RN she would like to have MRI imaging done at [facility]. RN pended MRI order per patients request for review and signature.

## 2019-03-21 ENCOUNTER — Telehealth (INDEPENDENT_AMBULATORY_CARE_PROVIDER_SITE_OTHER): Payer: Self-pay | Admitting: Neurology

## 2019-03-21 NOTE — Telephone Encounter (Signed)
patient  is requesting a call back with test results    Name of  test:  CBC w/ Diff Lavender  Liver Panel, Blood Green Plasma Separator Tube    Provider who ordered the test?  Dr. Berenice Primas     Date of Test: 03/09/2019    Best Call Back Number: ph. [phone]    Best Call Back Time: any     Is it ok to leave a Voicemail?  yes    Was pt informed of turnaround time? yes

## 2019-03-22 NOTE — Telephone Encounter (Signed)
Returned [place] phone call and reviewed lab results.      No further action required.     Erik Obey Mylz Yuan, PHARMD, BCPS, NCTTP

## 2019-03-24 NOTE — Progress Notes (Signed)
On 03/09/2019, Yolanda Bonine met with the study coordinator Annalise Miner to discuss potential enrollment in the [facility] MS study. Annalise reviewed the consent form with Jeniah, and gave them ample time to consider the risks involved, participant responsibilities and to ask questions if needed. Jasmine Schneider decided to enroll in the study. Annalise consented Jakerra, and she signed a HIPAA Authorization form after reviewing it with Annalise. Jocie was provided with a signed copies of her documents, along with Annalise's contact information to use anytime for questions or concerns.

## 2019-04-27 ENCOUNTER — Telehealth (INDEPENDENT_AMBULATORY_CARE_PROVIDER_SITE_OTHER): Payer: Self-pay | Admitting: Neurology

## 2019-04-27 NOTE — Telephone Encounter (Signed)
Placed a call to pt, notified that due to provider OOO on 06/29/19, pt's appt would have to be re-scheduled to 06/15/19.  @ 3:30 PM [facility].  Appt reminder mailed to pt's home address. Clinic call back number provided for any questions/concerns. Pt agreeable and appreciative of the call.

## 2019-05-23 ENCOUNTER — Telehealth (INDEPENDENT_AMBULATORY_CARE_PROVIDER_SITE_OTHER): Payer: Self-pay | Admitting: Neurology

## 2019-05-23 NOTE — Telephone Encounter (Signed)
Patient called in stated she completed all her MRI imaging in [place], she is requesting how MD would like to receive the discs, she stated she will be in [place] next week and can drop them off.     Please advise

## 2019-05-23 NOTE — Telephone Encounter (Signed)
Placed a call back to pt, notified that she could drop off the disc to clinic or mail it to [place], whichever is more convenient for pt. Per pt, she will be around the area on the 23rd of December and will drop off MRI discs to clinic then. Notified pt, she could leave disc to front desk so staff can upload the imaging results. Pt verbalized understanding.

## 2019-05-31 ENCOUNTER — Other Ambulatory Visit: Payer: Self-pay

## 2019-05-31 ENCOUNTER — Telehealth (INDEPENDENT_AMBULATORY_CARE_PROVIDER_SITE_OTHER): Payer: Self-pay | Admitting: Neurology

## 2019-05-31 NOTE — Telephone Encounter (Signed)
Patient dropped off MRI disk and CT spine. Handed to [facility] to upload.     Patient requesting to please mail back once uploaded.

## 2019-05-31 NOTE — Telephone Encounter (Signed)
Received discs from [facility] containing pt's MRI of the brain, cervical spine and thoracic spine completed on 12//11/20. Successfully uploaded to pt's chart via [place], available for MD to review.    Discs mailed back to pt's home address on file.

## 2019-06-13 ENCOUNTER — Encounter (INDEPENDENT_AMBULATORY_CARE_PROVIDER_SITE_OTHER): Payer: Self-pay | Admitting: Neurology

## 2019-06-15 ENCOUNTER — Telehealth (INDEPENDENT_AMBULATORY_CARE_PROVIDER_SITE_OTHER): Payer: TRICARE Prime—HMO | Admitting: Neurology

## 2019-06-15 DIAGNOSIS — G35 Multiple sclerosis: Secondary | ICD-10-CM

## 2019-06-15 NOTE — Patient Instructions (Signed)
RECOMMENDATIONS:  1.  Disease modifying therapy or Diagnostic testing:  Gilenya for another 6 months at least and recheck brain and C spine MRI in June at [place].     2.  Symptom management:  Can continue to try diet and exercise    3.  Physical therapy and/or exercise:  Continue regular exercise    4.  Diet and vitamins:    Take daily vitamin D3  2000   IU  Healthy well balanced diet.      5.  Next MRI due:  Repeat MRI Brain June 2021    6.  Next labs due:  Today    7.  Next vision exam due:  OCT  with next visit    8.  Other testing, recommendations, or consults:    9.  Follow-up in person or video  in 6 months    Education Provided: medication management of MS; covid-19 and MS    Resources for support groups and patient education materials can be found at  Www.nationalmssociety.org     MS Disease Modifying Therapies (DMT)  [email or web address]    Wellness in MS  [email or web address]      Research:    Please find below contact information for our MS team.  Please also be aware, since we are with patients in clinic all day, response times may be affected.    For scheduling, changing, or cancelling appointments, please call [phone] or [phone]    For MS medication questions, please contact or Pharmacist, Dr. Barbie Haggis via MyChart or [phone]    For new symptoms, clinical questions, or other nursing related concerns, please contact:         Dorise Bullion, RN for Dr. Luiz Blare via MyChart or [phone]       Anselmo Rod, RN for Dr. Arline Asp via MyChart or [phone]    If unable to reach me directly you may call the Call Center to get a message to our team at [phone]     To speak with one of our Research Coordinators with questions pertaining to research studies, please contact Annalise Miner or  Verlan Friends at [phone]    As a backup plan only, if you have difficulty reaching [place] through the above mechanisms, you may email Dr. Luiz Blare at [email or web address] for non-urgent matters.  Please note that email is not appropriate for urgent matters and MyChart is a preferred, secure messaging system.

## 2019-06-15 NOTE — Progress Notes (Signed)
---------------------(data below generated by Raynaldo Opitz, MD)--------------------     Patient Verification & Telemedicine Consent & Financial Waiver:    1.   Identity: I have verified this patient's identity to be accurate.  2.   Consent: I verify consent has been secured in one of the following methods: (a) obtained written/ online attestation consent (via MyChartVideoVisit pathway), (b) the spoke-side provider has obtained verbal or written consent from patient/surrogate (if this is a "provider to provider" evaluation), or (c) in all other cases, I have personally obtained verbal consent from the patient/ surrogate (noting all elements below) to perform this voluntary telemedicine evaluation (including obtaining history, performing examination and reviewing data provided by the patient).   The patient/ surrogate has the right to refuse this evaluation.  I have explained risks (including potential loss of confidentiality), benefits, alternatives, and the potential need for subsequent face to face care. Patient/ surrogate understands that there is a risk of medical inaccuracies given that our recommendations will be made based on reported data (and we must therefore assume this information is accurate).  Knowing that there is a risk that this information is not reported accurately, and that the telemedicine video, audio, or data feed may be incomplete, the patient agrees to proceed with evaluation and holds [place] harmless knowing these risks.  3.   Healthcare Team: The patient/ surrogate has been notified that other healthcare professionals (including students, residents and Metallurgist) may be involved in this audio-video evaluation.   All laws concerning confidentiality and patient access to medical records and copies of medical records apply to telemedicine.  4.   Privacy: If this is a Radiographer, therapeutic Visit, the patient/ surrogate has received the [place] Notice of Privacy Practices via  E-Checkin process.  For all other video visit techniques, I have verbally provided the patient/ surrogate with the [place] in [place] (https://[email or web address]) or Spanish ([email or web address]://[email or web address]://www.matthews.[email or web address]).  The patient/ surrogate acknowledges both being provided the NPP link, and has been offered to have the NPP mailed to the patient/ surrogate by [place] mail.  The patient/ surrogate has voiced understanding an acknowledgement of receipt of this NPP web address.  If the patient/surrogate has elected to receive the NPP via [place] mail, I verify that the NPP will be sent promptly to the patient/surrogate via [place] mail.  5.   Capacity: I have reviewed this above verification and consent paragraph with the patient/ surrogate and the patient is capacitated or has a surrogate. If the patient is not capacitated to understand the above, and no surrogate is available, since this is not an emergency evaluation, the visit will be rescheduled until such time that the patient can consent, or the surrogate is available to consent. If this is an emergency evaluation and the patient is not capacitated to understand the above, and no surrogate is available, I am proceeding with this evaluation as this is felt to be an emergency setting and no appropriate specialist is available at the bedside to perform these evaluations.  6.   Financial Waiver: If this is a Radiographer, therapeutic Visit, the patient has been made aware of the financial waiver via E-Checkin process.  For all other video visit techniques, an E-Checkin process is not performed.  As such, I have personally verbally informed the patient/ surrogate that this evaluation will be a billable encounter similar to an in-person clinic visit, and the patient/ surrogate has agreed to pay the fee for services rendered.  If we are billing insurance for  the patient's telehealth visit, her out-of-pocket cost will be determined based on her plan and will be  billed to her.  The patient/ surrogate has also been informed that if the patient does not have insurance or does not wish to use insurance, [place] Google price for a primary care telehealth visit is $59.00 and specialist telehealth visit is $88.00.  I have further informed the patient/ surrogate that in the event the patient has additional services provided in conjunction with the specialty visit (Ex. Psychotherapy services), those services will be billed at the current rate less a 45% discount.  7.   Intra-State Location: The patient/ surrogate attests to understanding that if the patient accesses these services from a location outside of [place], that the patient does so at the patient's own risk and initiative and that the patient is ultimately responsible for compliance with any laws or regulations associated with the patient's use.  8.   Specific Use:The patient/ surrogate understands that [place] makes no representation that materials or servicesdelivered via telecommunication services, or listed on telemedicine websites, are appropriate or available for use in any other location.           Demographics:  Medical Record #: [id number]  Date: June 15, 2019  Patient Name: Jasmine Schneider  DOB: 04-06-1996  Age: 24 year old  Sex: female  Location: Home address on file     Evaluator(s):  Jasmine Schneider was evaluated by me today.    Clinic Location: [place] ([place])  [place] [address]     MS Follow-up Note:    LV  03/09/2019 in-person  Today 06/15/19 video    Primary care physician:  Jasmine Schneider [facility]  [address]  [place] [address]    Referring physician  Jasmine Para, MD  [facility] [address]  [place], [place] [address]    I met with Jasmine Schneider in follow-up consultation today at the [facility].  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     Her husband is active duty Therapist, art and she is also followed at the [facility].     Interval history:  Started Gilenya 4.5 months ago.  Scan 3 months after starting showed one new lesion with some enhancement compared to May 2020.   No issues with first dose nor any side effects.  She had Lhermitte's symptom recently.  Planning a pregnancy in 1.5 - 2 years and using an OCP now.       Prior history:  First symptoms: April 2020 she had retro-orbital pain behind both eyes that then intensified on right side; then woke up with tinted vision in right eye (right ON).  First diagnosed as cluster headaches.  She ultimately saw ophthalmology and was diagnosed with optic neuritis.  On ROS at that visit she reported having tingling episodes in the past in her limbs.  Brain MRI April 30th showed lesions (3-4 bigger and smaller lesions) and she was diagnosed with MS. May 8th C and T spine MRIs done.  She also has a Chiari malformation. She was treated with Solumedrol for 5 days without taper.      Relapse today:  No  Most recent relapse  date: April 2020    Other prior relapses:   - 4 years ago had episode of bilateral leg tingling  - between that episode and her ON she also had a episode of left arm sensory changes     Current DMT: Gilenya   Prior Medications used:    Current MS related symptoms unchanged and  include: tingling around menstrual cycle, fatigue    She denies cognitive changes, depression, anxiety, current visual changes, hearing loss, tinnitus, vertigo, dysarthria/dysphagia, spasticity, weakness, numbness, incoordination, imbalance, falls, tremor, bowel/bladder dysfunction, headache, L'hermitte's sign, Uthoff's phenomenon.     Walking is unrestricted.   She is trying to exercise despite pandemic and [place]      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 Family history is otherwise notable for  Father has ITP, MGUS and diabetes type 2  Mom has melanoma    Social history: The patient is married and her husband is active duty in Therapist, art and is not deployed.  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  There is no history of drug or alcohol abuse. She  reports that she has never smoked. She has never used smokeless tobacco.    Medications:  Birth control pill    Allergies: No Known Allergies    Physical examination:  There were no vitals taken for this visit.     Physical examination:    Cardiovascular exam: No lower extremity edema.  Pedal pulses present bilaterally.    On neurological examination, the patient is alert and oriented to person, place and time. Cognition is grossly intact with normal language and short-term memory.      Cranial Nerves:  Snellen acuity equivalent to near card was 20/20 bilaterally. She identified 6/6 color plates correctly with right eye and 6/6 with the left.  Pupils are equal round and reactive to light, with no afferent pupillary defect. Undilated fundus exam was notable for very subtle temporal rim pallor. Visual fields were full to confrontation.  Alignment was orthophoric and extraocular movements were intact without nystagmus. Facial sensation was intact to light touch, pinprick and temperature in all three branches of the trigeminal nerve bilaterally. Facial strength was full and symmetric. Hearing was grossly intact. The palate elevated evenly. The trapezius and sternocleidomastoid  strength is full bilaterally. The tongue was midline and rapid alternating movements of the tongue were normal.       Motor Exam:   Pronator Drift: absent   Bulk: normal bulk   Tone: normal tone in all limbs   Fine finger movements were fast and symmetric.   Foot taps were fast and symmetric.   Power: Strength full (5/5) to confrontation with the following exceptions: None    Reflexes:    Right  Left    Biceps  2+  2+    Triceps  2+  2+    Brachioradialis  2+ 2+   Knee  2+  2+    Ankle  2+  2+    Plantar  flexor  flexor      Sensory Exam:   Light Touch  Normal    Pain  Normal    Temperature  Normal    Vibration  Normal    Proprioception  Normal        Coordination and Gait:  There is normal finger-nose-finger, heel-knee-shin and rapid alternating movements. There is no dysdiadochokinesia. The gait is normal, narrow-based and with normal heel, toe and tandem walk. Romberg sign was negative.         The EDSS score was 1 with the following functional scale scores: visual 1, brainstem 0, pyramidal 0, sensory 0, cerebellar 0, bowel and bladder 0, cerebral 0.      9-Hole PEG Test 03/09/2019   RUE  17.88   RUE (Best) 17.25   LUE 21.38   LUE (Best) 19.46       25 Ft. Ambulation Time 03/09/2019   25 ft Ambulation Time - 1st Trial (Seconds) 4.16   Independently or With Assistance? Independently   25 ft Ambulation Time - 2nd Trial (Seconds) 3.83   Independently or With Assistance? Independently       No flowsheet data found.    No flowsheet data found.    No flowsheet data found.    No flowsheet data found.      Imaging: The following imaging studies were reviewed in detail with the patient (images available for my review today)    05/19/19 [facility] MRIs brain and C spine  - I did not get report. Patient was told she had a new or partially enhancing lesion.  - Some artifact in C spine images but has lesion in posterior cord at C5 (same as prior).  Also seen is lesion I noted prior at C3-4. No enhancement in cervical cord.  - brain  axial T2 images and sagital flair images demonstrate >20 lesions consistent in location and appearance with MS.  She has multiple T1 hypointense lesions. I did not appreciate a clearly enhancing lesion on the multiple views provided.      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 >10 lesions consistent in appearance and location with MS    Cervical Spine and Thoracic MRI in [facility] hospital records report indicates a lesion at C5; by my review also lesion C2-4 and likely partial cord lesion mid-thoracic cord.           IMPRESSION:  As I discussed with the patient, Ms. Helmers had acute right ON April 2020. Likely prior sensory relapses and her imaging is consistent with RRMS.  She has done well clinically  No plans for immediate pregnancy.        RECOMMENDATIONS:  1.  Disease modifying therapy or Diagnostic testing:  Gilenya for another 6 months at least and recheck brain and C spine MRI in June at [place]. If reports can be sent I can try to look more closely at what lesion [facility] radiologist thought was enhancing.      2.  Symptom management:  Can continue to try diet and exercise    3.  Physical therapy and/or exercise:  Continue regular exercise    4.  Diet and vitamins:    Take daily vitamin D3  2000   IU  Healthy well balanced diet.      5.  Next MRI due:  Repeat MRI Brain June 2021    6.  Next labs due:  Today    7.  Next vision exam due:  OCT  with next visit    8.  Other testing, recommendations, or consults:    9.  Follow-up in person or video  in 6 months    Education Provided: medication management of MS; covid-19 and MS    Resources for support groups and patient education materials can be found at  [email or web address]     MS Disease Modifying Therapies (DMT)  [email or web address]    Wellness in  [place]  [email or web address]      Research:    Please find below contact information for our MS team.  Please also be aware, since we are with patients in clinic all day, response times may be affected.    For scheduling, changing, or cancelling appointments, please call [phone] or [phone]    For MS medication questions, please contact or Pharmacist, Dr. Bosie Clos via [place] or [phone]    For new symptoms, clinical questions, or other nursing related concerns, please contact:         Prudencio Pair, RN for Dr. Berenice Primas via MyChart or [phone]       Sheran Lawless, RN for Dr. Rosendo Gros via [place] or [phone]    If unable to reach me directly you may call the Call Center to get a message to our team at [phone]     To speak with one of our Research Coordinators with questions pertaining to research studies, please contact [place] or Alda Berthold at [phone]    As a backup plan only, if you have difficulty reaching [place] through the above mechanisms, you may email Dr. Berenice Primas at [email or web address] for non-urgent matters.  Please note that email is not appropriate for urgent matters and MyChart is a preferred, secure messaging system.    This encounter included time spent in the following categories:    Pre-visit: 10 minutes reviewing last visit, Care Everywhere, and recent imaging including scan with new lesion  Intra-Visit: 20  minutes updating relevant history, performing a video-based physical exam, making a diagnosis, creating and discussing a treatment plan.  and  Post-Visit: 10 minutes completing the note, placing orders, and coordinating care.  The total duration of this encounter including the Pre-Visit, Intra-Visit, and Post-Visit times, excluding separately reportable services/procedures was:  40 minutes

## 2019-06-17 ENCOUNTER — Encounter (INDEPENDENT_AMBULATORY_CARE_PROVIDER_SITE_OTHER): Payer: Self-pay | Admitting: Neurology

## 2019-06-23 ENCOUNTER — Encounter: Payer: Self-pay | Admitting: Hospital

## 2019-06-29 ENCOUNTER — Telehealth (INDEPENDENT_AMBULATORY_CARE_PROVIDER_SITE_OTHER): Payer: TRICARE Prime—HMO | Admitting: Neurology

## 2019-08-18 ENCOUNTER — Encounter (INDEPENDENT_AMBULATORY_CARE_PROVIDER_SITE_OTHER): Payer: Self-pay | Admitting: Hospital

## 2019-08-24 ENCOUNTER — Encounter (INDEPENDENT_AMBULATORY_CARE_PROVIDER_SITE_OTHER): Payer: Self-pay | Admitting: Hospital

## 2019-08-24 ENCOUNTER — Encounter (INDEPENDENT_AMBULATORY_CARE_PROVIDER_SITE_OTHER): Payer: Self-pay

## 2019-10-19 ENCOUNTER — Telehealth (INDEPENDENT_AMBULATORY_CARE_PROVIDER_SITE_OTHER): Payer: Self-pay | Admitting: Neurology

## 2019-10-19 NOTE — Telephone Encounter (Signed)
Scanned Gilenya RX Start form into media for future reference.

## 2019-11-10 ENCOUNTER — Encounter (INDEPENDENT_AMBULATORY_CARE_PROVIDER_SITE_OTHER): Payer: Self-pay | Admitting: Neurology

## 2019-11-14 NOTE — Telephone Encounter (Signed)
External MRI order has been faxed by Auth team as of 03/23/19.   Successfully re-faxed again to below fax number along with pt's demographics.     Routing as FYI only.

## 2019-12-19 ENCOUNTER — Encounter (INDEPENDENT_AMBULATORY_CARE_PROVIDER_SITE_OTHER): Payer: Self-pay | Admitting: Neurology

## 2019-12-19 NOTE — Telephone Encounter (Addendum)
External MRI order along with Pt Demographics successfully re-faxed to [facility] at alternative fax at [phone]. Pt made aware via mychart.    Routing as FYI only.

## 2019-12-19 NOTE — Telephone Encounter (Signed)
Pt was notified external MRI of the brain was re-faxed to [facility], however, pt is under the impression she also needs to obtain MRI of the C-spine and T-spine. Referral order placed was only for  MRI of the brain as well as on LOV note recommendation dated 06/15/19. Please advise. Thank you.

## 2019-12-19 NOTE — Telephone Encounter (Signed)
Received a call from [place] that [employer] states they still haven't received this and they provided her an alternative fax number as below:    [phone]    Will request LVN to re-send.     Recommended Jasmine Schneider follow up with [facility] in 1-2 days

## 2019-12-25 ENCOUNTER — Encounter (INDEPENDENT_AMBULATORY_CARE_PROVIDER_SITE_OTHER): Payer: Self-pay | Admitting: Neurology

## 2019-12-25 DIAGNOSIS — G35 Multiple sclerosis: Secondary | ICD-10-CM

## 2019-12-26 NOTE — Telephone Encounter (Signed)
MRI of the C-spine external order along with Pt's Demographics successfully faxed to [facility] Fax: [phone].    Pt notified of above information via [place].    Routing as FYI only.

## 2020-01-03 ENCOUNTER — Telehealth (INDEPENDENT_AMBULATORY_CARE_PROVIDER_SITE_OTHER): Payer: Self-pay | Admitting: Neurology

## 2020-01-03 DIAGNOSIS — G35 Multiple sclerosis: Secondary | ICD-10-CM

## 2020-01-03 NOTE — Telephone Encounter (Signed)
Patient is asking for Brain MRI and C-Spine to be sent  to [facility].  The fax Needs to include the patients  telephone and DOD # to be included. The patients  Phone # is [phone] and   DOD # [id number]. The fax for Lonni Fix is  # [phone]  Informed  patient of up to 72 hour turnaround time.  Patient informed we can message via mychart.to giver her confirmation that this request has been completed once it is complete. Thank You!

## 2020-01-03 NOTE — Telephone Encounter (Signed)
MRI of the brain is placed for external location. However, MRI of C-spine is for internal/[place] location.     Please place external MRI of C-spine order and I could fax to [facility] per pt's request. Thank you.

## 2020-01-04 NOTE — Telephone Encounter (Signed)
RN pended external MRI of cervical spine as requested for Dr. Luiz Blare review and signature.

## 2020-01-05 ENCOUNTER — Encounter (HOSPITAL_BASED_OUTPATIENT_CLINIC_OR_DEPARTMENT_OTHER): Payer: Self-pay | Admitting: Student in an Organized Health Care Education/Training Program

## 2020-01-05 NOTE — Telephone Encounter (Signed)
Successfully faxed MRI of the brain and C-spine orders to pt's preferred Radiology location DOD [facility] at Fax # [phone].    Attempted to call pt however, no answer. Unable to lvm d/t no vm. Pt notified via [employer].    Routing as FYI.

## 2020-01-13 NOTE — Telephone Encounter (Signed)
From: Narda Amber  To: Barbie Haggis, PHARMD  Sent: 01/05/2020 1:01 PM PDT  Subject: < No Subject>    Hi I have a question, I was wondering if 5-htp is okay for me to take with my Gilenya medication?   Thanks in advance      ----- Message -----   From:Madgeline Rayo, PHARMD   Sent:02/17/2019 4:01 PM PDT   ID:[id number] Hollice Espy   Subject:    Jasmine Schneider,    Disregard my previous message. I was able to see the results of the JCV test and it was the correct test. Your result was 0.16 which is a negative result. I notified Dr. Deloria Lair that we don't need to repeat this.    Take care,  Barbie Haggis, PharmD

## 2020-01-16 ENCOUNTER — Telehealth (INDEPENDENT_AMBULATORY_CARE_PROVIDER_SITE_OTHER): Payer: Self-pay | Admitting: Neurology

## 2020-01-16 ENCOUNTER — Encounter (HOSPITAL_BASED_OUTPATIENT_CLINIC_OR_DEPARTMENT_OTHER): Payer: Self-pay | Admitting: Student in an Organized Health Care Education/Training Program

## 2020-01-16 DIAGNOSIS — G35 Multiple sclerosis: Secondary | ICD-10-CM

## 2020-01-16 MED ORDER — GILENYA 0.5 MG PO CAPS
0.5000 mg | ORAL_CAPSULE | Freq: Every day | ORAL | 1 refills | Status: DC
Start: 2020-01-16 — End: 2020-04-16

## 2020-01-16 NOTE — Telephone Encounter (Signed)
Caller: Pt  Phone # [phone]   Relationship to patient: Self  Notes:     Calling stating that she spoke with her pharmacy, [facility] regarding Fingolimod HCl (GILENYA) 0.5 MG CAPS and was informed that the 90 days supply has expired. Pt is requesting another 90 days refill and states she only has 4 days left worth of medication. Pt is asking that her request be expedited as soon as possible since medication will be received via mail. Pt is requesting a call back once rx has been sent. Please advise.         Caller has been advised of 72 hr turnaround time.

## 2020-01-16 NOTE — Telephone Encounter (Signed)
Refilled Gilenya to [facility].     Notified Aiyana and requested recent labs

## 2020-01-16 NOTE — Telephone Encounter (Signed)
From: Narda Amber  To: Barbie Haggis, PHARMD  Sent: 01/16/2020 3:04 PM PDT  Subject: Velora Heckler and labs    Thank you. I did not realize I needed to have that updated for you but I called the hospital and it's been more than 6 months. Is there anyway you could send over the order for that?   The corpsman I spoke with told me since it would be faxed, if you could that they would like it to be on an order sheet with the Dr. Lenox Ponds, NPI # and address.   I was also wondering about my blood type. I am 24 years old and sadly, do not know what blood type I have. Is there anyway you can send over an order for that or would that be through my Primary care provider?   Thanks so much       ----- Message -----   From:Braleigh Massoud, PHARMD   Sent:01/16/2020 2:36 PM PDT   HW:[id number] Hollice Espy   Subject:Gilenya and labs    Hi [place] Citizen,    I refilled your Gilenya but I don't see any recent labwork. We need to review a CBC with differential and comprehensive metabolic panel every 6 months. Can you send [place] any recent labs or [place] and then send them over please?    Take care,  Barbie Haggis, PharmD

## 2020-01-17 ENCOUNTER — Telehealth (INDEPENDENT_AMBULATORY_CARE_PROVIDER_SITE_OTHER): Payer: Self-pay | Admitting: Neurology

## 2020-01-17 DIAGNOSIS — G35 Multiple sclerosis: Secondary | ICD-10-CM

## 2020-01-17 MED ORDER — FINGOLIMOD HCL 0.5 MG PO CAPS
0.5000 mg | ORAL_CAPSULE | Freq: Every day | ORAL | 0 refills | Status: DC
Start: 2020-01-17 — End: 2020-01-18

## 2020-01-17 NOTE — Telephone Encounter (Signed)
Placed outgoing call to [facility]. Sent rx for 14 day supply.     Also faxed labs requested to: [phone]

## 2020-01-17 NOTE — Telephone Encounter (Signed)
Received page that patient wanted update on Gilenya refill prescription and asking about her blood type. Per my chart review, Gilenya refill was ordered by pharmacist Barbie Haggis and authorized on 8/10 by Dr. Luiz Blare. Current status is "receipt confirmed by pharmacy." I informed patient that next step would be for [facility] to mail her the medication and she may be able to ask for expedited shipping from them. Patient asked for expedited shipping directly from medication's manufacturer, but I explained that I am not in contact with the manufacturer.     Patient also asked her blood type which we do not have in our records. I recommended discussing blood typing with either her PCP or by donating blood to the [facility] if no contraindication.    Marylin Crosby, MD  Neurology PGY2

## 2020-01-17 NOTE — Telephone Encounter (Signed)
Nettie Elm is calling regarding the medication Fingolimod HCl (GILENYA) 0.5 MG CAPS    She is requesting for MD to send a temporary prescription as it can take 7-10 business days to get the medication from [facility].     She states it should be sent to [facility]  F) [phone]  Please advise.

## 2020-01-17 NOTE — Telephone Encounter (Signed)
CallerMarena Chancy  Phone # [phone]  Relationship to patient: [facility]  Notes:     Marena Chancy is requesting for a prescription for Gilenya for a 14 day supply so that pt won't run out. She states that pt will be received medication in about [facility] Please advise.    Fax: [phone]  Ph. [phone]    Caller has been advised of 72 hr turnaround time.

## 2020-01-18 MED ORDER — FINGOLIMOD HCL 0.5 MG PO CAPS
0.5000 mg | ORAL_CAPSULE | Freq: Every day | ORAL | 0 refills | Status: DC
Start: 2020-01-18 — End: 2020-01-19

## 2020-01-18 NOTE — Telephone Encounter (Signed)
Called [facility] spoke with Maurine Minister they did receive the fax with the prescription. No refrence number was able to be given for this call. Bianca the assigned case manager, she will reach out to patient to dispense rx. Thank you.

## 2020-01-18 NOTE — Telephone Encounter (Signed)
Handled in alternate encounters.   Temporary supply sent to [facility] program  Rx for ongoing medication sent to [facility].

## 2020-01-18 NOTE — Telephone Encounter (Signed)
Faxed via rightfax and Epic to [phone]

## 2020-01-18 NOTE — Telephone Encounter (Signed)
CallerMarena Chancy  Phone # [phone]  Relationship to patient: none  Notes:     Rx has been received. Please re fax to [phone] and call back with any questions.    Caller has been advised of 48-72 hr turnaround time.

## 2020-01-19 NOTE — Telephone Encounter (Signed)
Received call from [facility] that Rx was received but did not have a signature.      Verbal called into [facility] (Pharmacy used by Gil[facility]ya for bridge prescriptions) at [phone].  Called in Gilenya 0.5mg  daily, #14 with     Stann Mainland Ethridge Sollenberger, PHARMD, BCPS, NCTTP

## 2020-02-06 ENCOUNTER — Other Ambulatory Visit: Payer: Self-pay

## 2020-02-06 ENCOUNTER — Telehealth (INDEPENDENT_AMBULATORY_CARE_PROVIDER_SITE_OTHER): Payer: Self-pay | Admitting: Neurology

## 2020-02-06 NOTE — Telephone Encounter (Signed)
Pt dropped off MRI of brain, spine, and T spine to be uploaded regarding Dr. Luiz Blare appt. Pt would like CD mailed back to her once it has been uploaded. Placed in MA Admin room.

## 2020-02-06 NOTE — Telephone Encounter (Signed)
Received disc containing the following records: MRI of the brain, C-spine and T-spine dated 01/26/20, uploaded via [facility], available for MD's review.    Disc mailed back to pt's home address on file as requested. Pt notified via mychart.

## 2020-02-07 NOTE — Telephone Encounter (Addendum)
Pt is calling regarding message below. She would like to confirm that MD has the imaging report and the actual pictures of the imaging.     P) [phone]    Please advise.

## 2020-02-09 NOTE — Telephone Encounter (Signed)
RN telephoned the patient and confirmed we received the disk, imaging, and medical records. RN also made reference to the fact the patient did not have a follow up appointment scheduled and she indicated she was waiting to hear about the results and didn't know if she should schedule. RN informed her that per last OV note, Dr. Luiz Blare wanted to see her in 6 months. She was last seen the end of January so patient needs to be scheduled.    Per patient, [place] to have a telemedicine visit. RN will help coordinate with Dr. Luiz Blare to arrange appointment.

## 2020-02-13 ENCOUNTER — Encounter (HOSPITAL_BASED_OUTPATIENT_CLINIC_OR_DEPARTMENT_OTHER): Payer: Self-pay | Admitting: Student in an Organized Health Care Education/Training Program

## 2020-02-16 NOTE — Telephone Encounter (Signed)
Pt called following up on previous message. She called to schedule next available appt (Dec 2021). Pt requesting a call back to go over MRI results sooner than Dec. She just wants to make sure there are no new lesions. Please advise

## 2020-02-19 NOTE — Telephone Encounter (Signed)
From: Narda Amber  To: Barbie Haggis, PHARMD  Sent: 02/13/2020 12:25 PM PDT  Subject: Velora Heckler and labs    Imsorry I just saw this and am just getting back to you. The fax number is [phone]      ----- Message -----   From:Evelina Lore, PHARMD   Sent:01/16/2020 4:55 PM PDT   TT:[id number] [name]   Subject:RE: Gilenya and labs    HI Jasmine Schneider,    I'm happy to fax this order but what is the best fax number for this? Let me know and we'll send over the orders.     Your primary care would be best for ordering your blood type.    Take care,  Barbie Haggis, PharmD      ----- Message -----   From:Jasmine Schneider   Sent:01/16/2020 3:04 PM PDT   ZE:[id number] Ladonya Jerkins, PHARMD   Subject:Gilenya and labs    Thank you. I did not realize I needed to have that updated for you but I called the hospital and it's been more than 6 months. Is there anyway you could send over the order for that?   The corpsman I spoke with told me since it would be faxed, if you could that they would like it to be on an order sheet with the Dr. Lenox Ponds, NPI # and address.   I was also wondering about my blood type. I am 24 years old and sadly, do not know what blood type I have. Is there anyway you can send over an order for that or would that be through my Primary care provider?   Thanks so much       ----- Message -----   From:Brittne Kawasaki, PHARMD   Sent:01/16/2020 2:36 PM PDT   TM:[id number] [name]   Subject:Gilenya and labs    Hi Jasmine Schneider,    I refilled your Gilenya but I don't see any recent labwork. We need to review a CBC with differential and comprehensive metabolic panel every 6 months. Can you send [place] any recent labs [place] please?    Take care,  Barbie Haggis, PharmD

## 2020-02-21 NOTE — Telephone Encounter (Addendum)
Returned pt's call, confirmed if she received MRI reports along with the copy of disc containing MRI results as clinic only received disc containing MRI results from 01/26/20. Pt stated she's currently at work and does not recall paper reports and requested for [place] to request reports at [facility].    Successfully faxed request to obtain MRI reports to [facility] at [phone].    Routing as FYI only.

## 2020-02-27 ENCOUNTER — Telehealth (INDEPENDENT_AMBULATORY_CARE_PROVIDER_SITE_OTHER): Payer: Self-pay | Admitting: Neurology

## 2020-02-27 DIAGNOSIS — G35 Multiple sclerosis: Secondary | ICD-10-CM

## 2020-02-27 NOTE — Telephone Encounter (Addendum)
Pt called in to inform MD that MRI results had been sent to clinic. CC agent confirmed receiving MRI results. Results scanned into media. Pt kindly requests a call back once results have been reviews  To discuss with MD. Pt also added if she would be okay with appt in December or if there is anyway to be double booked sooner Please advise.      P) [phone]  Okay to LVM

## 2020-02-28 NOTE — Telephone Encounter (Signed)
Returned pt's call, offered 10/12 @ 5 PM slot via [facility]. Pt agreeable and is now scheduled. Pt also has another appt already scheduled on 05/23/20, which pt requested to keep for the meantime.    Please place f/u referral order so I can attach to pt's appt. Thank you.  Briscoe Burns, MD  You; Adriana Simas, RN 10 minutes ago (4:16 PM)         5pm Tuesday OCt 12th    Message text

## 2020-02-28 NOTE — Telephone Encounter (Signed)
MRI reports are now under media and images are under Imaging tab dated 01/26/20. Pt's current appt is on 05/23/20 but is requesting sooner appt to discuss MRI results.    Please advise if pt could be added to a Tuesday slot to discuss MRI results. Thank you.

## 2020-02-29 NOTE — Telephone Encounter (Signed)
RN pended follow up orders as requested for Dr. Graves review and signature.

## 2020-02-29 NOTE — Telephone Encounter (Signed)
F/u order signed.

## 2020-03-19 ENCOUNTER — Encounter (INDEPENDENT_AMBULATORY_CARE_PROVIDER_SITE_OTHER): Payer: Self-pay

## 2020-03-19 ENCOUNTER — Telehealth (INDEPENDENT_AMBULATORY_CARE_PROVIDER_SITE_OTHER): Payer: TRICARE Prime—HMO | Admitting: Neurology

## 2020-03-19 ENCOUNTER — Telehealth (INDEPENDENT_AMBULATORY_CARE_PROVIDER_SITE_OTHER): Payer: Self-pay | Admitting: Neurology

## 2020-03-19 ENCOUNTER — Encounter (INDEPENDENT_AMBULATORY_CARE_PROVIDER_SITE_OTHER): Payer: Self-pay | Admitting: Neurology

## 2020-03-19 DIAGNOSIS — G35 Multiple sclerosis: Secondary | ICD-10-CM

## 2020-03-19 NOTE — Patient Instructions (Addendum)
We reviewed her scans in detail together.  MRIs of the spine are stable.  MRI Brain has a new, non-enhancing  Lesion since Dec 2020.  Gilenya started August 2020.      We discussed that this is a very mild breakthrough and there are pros and cons to switching therapies.  She, however, also plans pregnancy in the next year and we discussed need to come off Gilenya at least 2 months before conception and that a switch to Ocrevus sometime this year would make sense.     For now will stay with Gilenya, but plan for switch within the year - likely in 6 months.     Needs to be off Gilenya for 2 months before conception and then would give Ocrevus 1 month after stopping Gilenya and then can try to get pregnant starting 3 months after Ocrevus.      She is taking Vitamin D3 3000 international units daily.   I also provided counseling on her MS fatigue and her current supplements.          Fatigue and MS:    Up to 90% of people with MS suffer from fatigue which can be debilitating and interfere with work and life.   Its important to rule out other causes of fatigue including poor sleep due to insomnia, anxiety or overactive bladder.  Working odd hours and not getting enough sleep (at least 8 hours) may also contribute.  Anemia (low red blood cell and/or hematocrit levels) and underactive thyroid are unrelated to MS but may also cause fatigue. If suspected, lab work can be done to rule these problems out.      Some patients have seen dramatic improvements in fatigue after changing their diets and incorporating more exercise into their lives.  Some obvious changes that are a good first step are to eliminate processed foods, highly refined sugars like high fructose corn syrup, and make attempts to eat whole, natural foods that do not come in a package, such as fruits, vegetables and natural or organic meats.  Many patients claim that eliminating gluten (wheat, rye and barley) or pasteurized dairy products have helped their  fatigue.  Some diets that have shown promise in improving MS-related fatigue are the [employer], [employer] vegan diet and the low-fat Swank diet.       Some supplements have also shown to help with fatigue however please discuss these with your doctor before starting any of these:     1. ? Vitamin D3, 2,000-4,000 IU by mouth daily  2. ? Acetyl L-Carnitine 1000 by mouth twice a day  3. ? Co-Q-10 500 mg by mouth daily  4. ? Omega 3 (EPA+DHA) 1,000-2,000 mg by mouth daily    And lastly if the level of fatigue is debilitating and the above measures have been unsuccessful there are medications that can be helpful in patients with MS.  However many of these are stimulants and amphetamines which can interfere with sleep or worsen/cause anxiety.      1. ? Amantadine (Symmetrel) works by increasing dopamine responses.    2. ? Modafanil (Provigil) and armodafanil (Nuvigil) are dopamine reuptake inhibitors.  3. ? Lisdesamfetamine (Vyvanse) is a newer medication which stimulates central nervous system activity.    4. ? Dextroamphetamine/amphetamine (Adderall) and methylphenidate (Ritalin) both prescribed for ADHD, are also used for MS-related fatigue by inhibiting reuptake of norepinephrine and dopamine.

## 2020-03-19 NOTE — Progress Notes (Signed)
We converted to telephone visit.  We reviewed her scans in detail together.  MRIs of the spine are stable.  MRI Brain has a new, non-enhancing  Lesion since Dec 2020.  Gilenya started August 2020.      We discussed that this is a very mild breakthrough and there are pros and cons to switching therapies.  She, however, also plans pregnancy in the next year and we discussed need to come off Gilenya at least 2 months before conception and that a switch to Ocrevus sometime this year would make sense.     For now will stay with Gilenya, but plan for switch within the year - likely in 6 months.     Needs to be off Gilenya for 2 months before conception and then would give Ocrevus 1 month after stopping Gilenya and then can try to get pregnant starting 3 months after Ocrevus.      She is taking Vitamin D3 3000 international units daily.   I also provided counseling on her MS fatigue and her current supplements.      I spent 23 minutes on the phone with patient addressing the above.     Jasmine Schneider

## 2020-03-19 NOTE — Telephone Encounter (Signed)
I called pt to remind of today's appt, but no answer phone number keeps ringing and no VM alert thanks.

## 2020-03-21 ENCOUNTER — Encounter (INDEPENDENT_AMBULATORY_CARE_PROVIDER_SITE_OTHER): Payer: Self-pay | Admitting: Hospital

## 2020-04-16 ENCOUNTER — Telehealth (INDEPENDENT_AMBULATORY_CARE_PROVIDER_SITE_OTHER): Payer: Self-pay | Admitting: Neurology

## 2020-04-16 DIAGNOSIS — G35 Multiple sclerosis: Secondary | ICD-10-CM

## 2020-04-16 MED ORDER — GILENYA 0.5 MG PO CAPS
0.5000 mg | ORAL_CAPSULE | Freq: Every day | ORAL | 1 refills | Status: DC
Start: 2020-04-16 — End: 2020-10-09

## 2020-04-16 NOTE — Telephone Encounter (Signed)
Prescription Refill Request     Incoming call from patient requesting refill     Prescribing provider: Dr.Graves    Last office visit: 03/19/2020  Next office visit: 05/23/2020    Requested Medication/s:   Fingolimod HCl (GILENYA) 0.5 MG CAPS         Send to:         [facility] - [place], [place] - [address]  [address]  [place] [address]  Phone: [phone] Fax: [phone] Alternate Fax: [phone]        Is patient out of medication? No    Has Patient been advised this message will be transmitted to office and if any issues can expect a response within the next 72 hours? yes

## 2020-04-16 NOTE — Telephone Encounter (Signed)
Pt called to confirm that lab results were received from [facility]. [facility] advised pt fax was received on Saturday 04/13/2020.    Please review and advise.  Ph. [phone]      Advised of 24-72 hr turn around time    Faxed moved to urgent folder

## 2020-04-16 NOTE — Telephone Encounter (Signed)
Labs received are CBC and CMP and are as expected.     Notified via MyChart

## 2020-04-16 NOTE — Telephone Encounter (Signed)
Plan at last office visit was to continue Gilenya for now    Labs completed 04/12/20 and scanned into media:  CBC as expected, wnl except ALC 0.6  CMP wnl    Refilled Gilenya

## 2020-05-23 ENCOUNTER — Telehealth (INDEPENDENT_AMBULATORY_CARE_PROVIDER_SITE_OTHER): Payer: TRICARE Prime—HMO | Admitting: Neurology

## 2020-05-23 DIAGNOSIS — G35 Multiple sclerosis: Secondary | ICD-10-CM

## 2020-05-23 NOTE — Patient Instructions (Signed)
1. Agree with working with your PCP to figure out possible hormonal imbalance and irregular spotting   2. Continue with supplements for fatigue  3. Follow up with Dr. Luiz Blare in 6 months, or sooner if needed    Please inform us if you plan on becoming pregnant or if your labs show you are pregnant

## 2020-05-23 NOTE — Progress Notes (Signed)
Patient Verification & Telemedicine Consent & Financial Waiver:    1.   Identity: I have verified this patient's identity to be accurate.  2.   Consent: I verify consent has been secured in one of the following methods: (a) obtained written/ online attestation consent (via MyChartVideoVisit pathway), (b) the spoke-side provider has obtained verbal or written consent from patient/surrogate (if this is a "provider to provider" evaluation), or (c) in all other cases, I have personally obtained verbal consent from the patient/ surrogate (noting all elements below) to perform this voluntary telemedicine evaluation (including obtaining history, performing examination and reviewing data provided by the patient).   The patient/ surrogate has the right to refuse this evaluation.  I have explained risks (including potential loss of confidentiality), benefits, alternatives, and the potential need for subsequent face to face care. Patient/ surrogate understands that there is a risk of medical inaccuracies given that our recommendations will be made based on reported data (and we must therefore assume this information is accurate).  Knowing that there is a risk that this information is not reported accurately, and that the telemedicine video, audio, or data feed may be incomplete, the patient agrees to proceed with evaluation and holds us harmless knowing these risks.  3.   Healthcare Team: The patient/ surrogate has been notified that other healthcare professionals (including students, residents and technical personnel) may be involved in this audio-video evaluation.   All laws concerning confidentiality and patient access to medical records and copies of medical records apply to telemedicine.  4.   Privacy: If this is a MyChart Video Visit, the patient/ surrogate has received the [facility] Notice of Privacy Practices via E-Checkin process.  For all other video visit techniques, I have verbally provided the patient/ surrogate with  the [facility] web link in English (https://health..edu/hipaa/Pages/hipaa.aspx) or Spanish (https://health..edu/hipaa/Pages/hipaa_sp.aspx).  The patient/ surrogate acknowledges both being provided the NPP link, and has been offered to have the NPP mailed to the patient/ surrogate by US mail.  The patient/ surrogate has voiced understanding an acknowledgement of receipt of this NPP web address.  If the patient/surrogate has elected to receive the NPP via US mail, I verify that the NPP will be sent promptly to the patient/surrogate via US mail.  5.   Capacity: I have reviewed this above verification and consent paragraph with the patient/ surrogate and the patient is capacitated or has a surrogate. If the patient is not capacitated to understand the above, and no surrogate is available, since this is not an emergency evaluation, the visit will be rescheduled until such time that the patient can consent, or the surrogate is available to consent. If this is an emergency evaluation and the patient is not capacitated to understand the above, and no surrogate is available, I am proceeding with this evaluation as this is felt to be an emergency setting and no appropriate specialist is available at the bedside to perform these evaluations.  6.   Financial Waiver: If this is a MyChart Video Visit, the patient has been made aware of the financial waiver via E-Checkin process.  For all other video visit techniques, an E-Checkin process is not performed.  As such, I have personally verbally informed the patient/ surrogate that this evaluation will be a billable encounter similar to an in-person clinic visit, and the patient/ surrogate has agreed to pay the fee for services rendered.  If we are billing insurance for the patient's telehealth visit, her out-of-pocket cost will be determined based on her   plan and will be billed to her.  The patient/ surrogate has also been informed that if the patient does not have insurance or  does not wish to use insurance, [facility] price for a primary care telehealth visit is $59.00 and specialist telehealth visit is $88.00.  I have further informed the patient/ surrogate that in the event the patient has additional services provided in conjunction with the specialty visit (Ex. Psychotherapy services), those services will be billed at the current rate less a 45% discount.  7.   Intra-State Location: The patient/ surrogate attests to understanding that if the patient accesses these services from a location outside of [place], that the patient does so at the patient's own risk and initiative and that the patient is ultimately responsible for compliance with any laws or regulations associated with the patient's use.  8.   Specific Use:The patient/ surrogate understands that [facility] makes no representation that materials or servicesdelivered via telecommunication services, or listed on telemedicine websites, are appropriate or available for use in any other location.           Demographics:  Medical Record #: [id number]  Date: May 23, 2020  Patient Name: Jasmine Schneider  DOB: [birth year]  Age: 24 year old  Sex: female  Location: Home address on file     Evaluator(s):  Jannelly Bergren was evaluated by me today.    Clinic Location: [facility]  [place] [address]     MS Follow-up Note:    Primary care physician:  [facility]  [place] [address]    Referring physician  Leane Para, MD  [facility] [address]  [place], [place] [address]    I met with Jasmine Schneider in follow-up consultation today at the [facility].  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     Her husband is active duty Therapist, art  and she is also followed at the [facility].     Interval history:  Since the last visit, she has been having irregular bleeding. She is spotting mid-cycle. She is being evaluated by the PCP, also discussed increased acne. She has also been having hair loss, with notable shedding when showering or brushing her hair. She is on OCPs.     Her MS is stable, though her fatigue significantly affects her. She is still able to do the things she needs to. Dr. Berenice Primas recommended fish oil, acetylcarnitine, co Q 10, which she is taking. There has been some benefit, but symptoms are still present. No concern for flares. She had previously tried amantadine before, but after one week noticed no difference. She prefers taking a natural route.       Prior history:  First symptoms: April 2020 she had retro-orbital pain behind both eyes that then intensified on right side; then woke up with tinted vision in right eye (right ON).  First diagnosed as cluster headaches.  She ultimately saw ophthalmology and was diagnosed with optic neuritis.  On ROS at that visit she reported having tingling episodes in the past in her limbs.  Brain MRI April 30th showed lesions (3-4 bigger and smaller lesions) and she was diagnosed with  MS. May 8th C and T spine MRIs done.  She also has a Chiari malformation. She was treated with Solumedrol for 5 days without taper.      Relapse today:  No  Most recent relapse date: April 2020    Other prior relapses:   - 4 years ago had episode of bilateral leg tingling  - between that episode and her ON she also had a episode of left arm sensory changes     Current DMT: Gilenya   Prior Medications used:    Current MS related symptoms unchanged and  include: tingling around menstrual cycle, fatigue    She denies cognitive changes, depression, anxiety, current visual changes, hearing loss, tinnitus, vertigo, dysarthria/dysphagia, spasticity, weakness, numbness, incoordination, imbalance, falls, tremor, bowel/bladder  dysfunction, headache, Lhermittes sign, Uthoffs phenomenon.     Walking is unrestricted.   She is trying to exercise despite pandemic [place]      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 Family history is otherwise notable for  Father has ITP, MGUS and diabetes type 2  Mom has melanoma    Social history: The patient is married and her husband is active duty in Therapist, art and is not deployed.  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  There is no history of drug or alcohol abuse. She  reports that she has never smoked. She has never used smokeless tobacco.    Medications:  Birth control pill    Allergies: No Known Allergies    Physical examination:  There were no vitals taken for this visit.     Physical examination:    Telemedicine exam, see prior exam below  EOMI, normal smooth pursuit, normal saccades  Face symmetric  Hearing intact  Tongue midline  Shoulder shrug present BL  Moving all limbs symmetrically, able to do squat unassisted  Normal casual gait, normal base, normal arm swing  Able to do toe, heel and tandem walk  Able to hop at least 4 times in each foot    ----  Cardiovascular exam: No lower extremity edema.  Pedal pulses present bilaterally.    On neurological examination, the patient is alert and oriented to person, place and time. Cognition is grossly intact with normal language and short-term memory.      Cranial Nerves:  Snellen acuity equivalent to near card was 20/20 bilaterally. She identified 6/6  color plates correctly with right eye and 6/6 with the left.  Pupils are equal round and reactive to light, with no afferent pupillary defect. Undilated fundus exam was notable for very subtle temporal rim pallor. Visual fields were full to confrontation.  Alignment was orthophoric and extraocular movements were intact without nystagmus. Facial sensation was intact to light touch, pinprick and temperature in all three branches of the trigeminal nerve bilaterally. Facial strength was full and symmetric. Hearing was grossly intact. The palate elevated evenly. The trapezius and sternocleidomastoid strength is full bilaterally. The tongue was midline and  rapid alternating movements of the tongue were normal.       Motor Exam:   Pronator Drift: absent   Bulk: normal bulk   Tone: normal tone in all limbs   Fine finger movements were fast and symmetric.   Foot taps were fast and symmetric.   Power: Strength full (5/5) to confrontation with the following exceptions: None    Reflexes:    Right  Left    Biceps  2+  2+    Triceps  2+  2+    Brachioradialis  2+ 2+   Knee  2+  2+    Ankle  2+  2+    Plantar  flexor  flexor      Sensory Exam:   Light Touch  Normal    Pain  Normal    Temperature  Normal    Vibration  Normal    Proprioception  Normal        Coordination and Gait:  There is normal finger-nose-finger, heel-knee-shin and rapid alternating movements. There is no dysdiadochokinesia. The gait is normal, narrow-based and with normal heel, toe and tandem walk. Romberg sign was negative.         The EDSS score was 1 with the following functional scale scores: visual 1, brainstem 0, pyramidal 0, sensory 0, cerebellar 0, bowel and bladder 0, cerebral 0.      9-Hole PEG Test 03/09/2019   RUE  17.88   RUE (Best) 17.25   LUE 21.38   LUE (Best) 19.46       25 Ft. Ambulation Time 03/09/2019   25 ft Ambulation Time - 1st Trial (Seconds) 4.16   Independently or With Assistance? Independently   25 ft Ambulation Time - 2nd Trial  (Seconds) 3.83   Independently or With Assistance? Independently       No flowsheet data found.    No flowsheet data found.    No flowsheet data found.    No flowsheet data found.    Labs 04/12/2020  WBC 4.6  Lymphocytes 0.6 (L)  BMP nl  LFT AST 15 ALT 19      Imaging: The following imaging studies were reviewed in detail with the patient (images available for my review today)    05/19/19 [facility] MRIs brain and C spine  - I did not get report. Patient was told she had a new or partially enhancing lesion.  - Some artifact in C spine images but has lesion in posterior cord at C5 (same as prior).  Also seen is lesion I noted prior at C3-4. No enhancement in cervical cord.  - brain axial T2 images and sagital flair images demonstrate >20 lesions consistent in location and appearance with MS.  She has multiple T1 hypointense lesions. I did not appreciate a clearly enhancing lesion on the multiple views provided.      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 >10 lesions consistent in appearance and location with MS    Cervical Spine and Thoracic MRI in [facility] hospital records report indicates a lesion at C5; by my review also lesion C2-4 and likely partial cord lesion mid-thoracic cord.           IMPRESSION:  As I discussed with the patient, Jasmine Schneider had acute right ON April 2020. Likely prior sensory relapses and her imaging is consistent with RRMS.  She has done well clinically  No plans for immediate pregnancy.  RECOMMENDATIONS:  1.  Disease modifying therapy or Diagnostic testing:  Gilenya continued for now, patient aware to inform us when she wishes to try to become pregnant. At that time will switch to Ocrevus.     2.  Symptom management:  Can continue to try diet and exercise  Continue acylcarnitine, Co Q 10, fish oil    3.  Physical therapy and/or exercise:  Continue regular exercise    4.  Diet and vitamins:  Take daily vitamin D3   2000   IU  Healthy well balanced diet.      5.  Next MRI due:  Repeat MRI Brain June 2022    6.  Next labs due:  6 months    7.  Next vision exam due:  OCT  with next visit    8.  Other testing, recommendations, or consults:    9.  Follow-up in person or video  in 6 months    Education Provided: medication management of MS; covid-19 and MS    Resources for support groups and patient education materials can be found at  [email or web address]     MS Disease Modifying Therapies (DMT)  [email or web address]    Wellness in [place]  [email or web address]      Research:    Please find below contact information for our MS team.  Please also be aware, since we are with patients in clinic all day, response times may be affected.    For scheduling, changing, or cancelling appointments, please call [phone] or [phone]    For MS medication questions, please contact or Pharmacist, Dr. Bosie Clos via [place] or [phone]    For new symptoms, clinical questions, or other nursing related concerns, please contact:         Prudencio Pair, RN for Dr. Berenice Primas via MyChart or [phone]       Sheran Lawless, RN for Dr. Rosendo Gros via [place] or [phone]    If unable to reach me directly you may call the Call Center to get a message to our team at [phone]     To speak with one of our Research Coordinators with questions pertaining to research studies, please contact [place] or Alda Berthold at [phone]    As a backup plan only, if you have difficulty reaching [place] through the above mechanisms, you may email Dr. Berenice Primas at [email or web address] for non-urgent matters.  Please note that email is not appropriate for urgent matters and MyChart is a preferred, secure messaging system.        Patient is seen and discussed  with the attending Dr. Jeanell Sparrow, MD  Neurology Resident PGY-4

## 2020-05-25 ENCOUNTER — Encounter (INDEPENDENT_AMBULATORY_CARE_PROVIDER_SITE_OTHER): Payer: Self-pay | Admitting: Neurology

## 2020-05-25 NOTE — Progress Notes (Signed)
Attending Note:    Patient interviewed and examined on 05/23/20 , and I have reviewed the note by Dr. Allene Dillon from 05/23/20.  I participated in and supervised all aspects of the visit including the examination.    I have edited the note and  agree w/ the fellow history, exam, assessment, and plan as documented. I have reviewed images and agree with trainee's interpretation.    In brief, she has RRMS and has been stable on Gilenya but has continued issues with fatigue and PCOS like symptoms, the latter being worked up by [employer] [facility].  When ready for pregnancy will need to change DMT.      Georgeanna Lea, MD, PhD    This encounter included time spent in the following categories:    Pre-visit: 5 minutes reviewing last visit, Care Everywhere, and recent diagnostic tests  Intra-Visit: 30 minutes updating relevant history, performing a video-based physical exam, making a diagnosis, creating and discussing a treatment plan.  and  Post-Visit: 10  minutes completing the note, placing orders, and coordinating care.  The total duration of this encounter including the Pre-Visit, Intra-Visit, and Post-Visit times, excluding separately reportable services/procedures was:  45 minutes

## 2020-05-25 NOTE — Progress Notes (Signed)
Patient Verification & Telemedicine Consent & Financial Waiver:    1.   Identity: I have verified this patient's identity to be accurate.  2.   Consent: I verify consent has been secured in one of the following methods: (a) obtained written/ online attestation consent (via MyChartVideoVisit pathway), (b) the spoke-side provider has obtained verbal or written consent from patient/surrogate (if this is a "provider to provider" evaluation), or (c) in all other cases, I have personally obtained verbal consent from the patient/ surrogate (noting all elements below) to perform this voluntary telemedicine evaluation (including obtaining history, performing examination and reviewing data provided by the patient).   The patient/ surrogate has the right to refuse this evaluation.  I have explained risks (including potential loss of confidentiality), benefits, alternatives, and the potential need for subsequent face to face care. Patient/ surrogate understands that there is a risk of medical inaccuracies given that our recommendations will be made based on reported data (and we must therefore assume this information is accurate).  Knowing that there is a risk that this information is not reported accurately, and that the telemedicine video, audio, or data feed may be incomplete, the patient agrees to proceed with evaluation and holds us harmless knowing these risks.  3.   Healthcare Team: The patient/ surrogate has been notified that other healthcare professionals (including students, residents and technical personnel) may be involved in this audio-video evaluation.   All laws concerning confidentiality and patient access to medical records and copies of medical records apply to telemedicine.  4.   Privacy: If this is a MyChart Video Visit, the patient/ surrogate has received the [facility] Notice of Privacy Practices via E-Checkin process.  For all other video visit techniques, I have verbally provided the patient/ surrogate with  the [place] web link in English (https://health.[place].edu/hipaa/Pages/hipaa.aspx) or Spanish (https://health.[place].edu/hipaa/Pages/hipaa_sp.aspx).  The patient/ surrogate acknowledges both being provided the NPP link, and has been offered to have the NPP mailed to the patient/ surrogate by US mail.  The patient/ surrogate has voiced understanding an acknowledgement of receipt of this NPP web address.  If the patient/surrogate has elected to receive the NPP via US mail, I verify that the NPP will be sent promptly to the patient/surrogate via US mail.  5.   Capacity: I have reviewed this above verification and consent paragraph with the patient/ surrogate and the patient is capacitated or has a surrogate. If the patient is not capacitated to understand the above, and no surrogate is available, since this is not an emergency evaluation, the visit will be rescheduled until such time that the patient can consent, or the surrogate is available to consent. If this is an emergency evaluation and the patient is not capacitated to understand the above, and no surrogate is available, I am proceeding with this evaluation as this is felt to be an emergency setting and no appropriate specialist is available at the bedside to perform these evaluations.  6.   Financial Waiver: If this is a MyChart Video Visit, the patient has been made aware of the financial waiver via E-Checkin process.  For all other video visit techniques, an E-Checkin process is not performed.  As such, I have personally verbally informed the patient/ surrogate that this evaluation will be a billable encounter similar to an in-person clinic visit, and the patient/ surrogate has agreed to pay the fee for services rendered.  If we are billing insurance for the patient's telehealth visit, her out-of-pocket cost will be determined based on her   plan and will be billed to her.  The patient/ surrogate has also been informed that if the patient does not have insurance or  does not wish to use insurance, [place] Google price for a primary care telehealth visit is $59.00 and specialist telehealth visit is $88.00.  I have further informed the patient/ surrogate that in the event the patient has additional services provided in conjunction with the specialty visit (Ex. Psychotherapy services), those services will be billed at the current rate less a 45% discount.  7.   Intra-State Location: The patient/ surrogate attests to understanding that if the patient accesses these services from a location outside of [place], that the patient does so at the patient's own risk and initiative and that the patient is ultimately responsible for compliance with any laws or regulations associated with the patient's use.  8.   Specific Use:The patient/ surrogate understands that [facility] makes no representation that materials or servicesdelivered via telecommunication services, or listed on telemedicine websites, are appropriate or available for use in any other location.           Demographics:  Medical Record #: [id number]  Date: May 23, 2020  Patient Name: Jasmine Schneider  DOB: 04/21/1996  Age: 24 year old  Sex: female  Location: Home address on file     Evaluator(s):  Maicy Filip was evaluated by me today.    Clinic Location: [place] [facility]  [place] [address]     MS Follow-up Note:    Primary care physician:  [facility], [place]  [place] [address]    Referring physician  Leane Para, MD  [facility] [address]  [place] [address]    I met with Ms. Lyter in follow-up consultation today at the [facility].  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     Her husband is active duty Therapist, art  and she is also followed at the [facility].     Interval history:  Since the last visit, she has been having irregular menstrual bleeding. She is spotting mid-cycle. She is being evaluated by the PCP, with whom she also discussed increased acne. She has also been having hair loss, with notable shedding when showering or brushing her hair. She is on OCPs and being worked up for PCOS.    Her MS is stable, though her fatigue significantly affects her. She is still able to do the things she needs to do. She is taking recommended the fish oil, acetylcarnitine, co Q 10.. There has been some benefit, but symptoms are still present. No concern for flares. She had previously tried amantadine before, but after one week noticed no difference. She prefers taking a natural route.       Prior history:  First symptoms: April 2020 she had retro-orbital pain behind both eyes that then intensified on right side; then woke up with tinted vision in right eye (right ON).  First diagnosed as cluster headaches.  She ultimately saw ophthalmology and was diagnosed with optic neuritis.  On ROS at that visit she reported having tingling episodes in the past in her limbs.  Brain MRI April 30th showed lesions (3-4 bigger  and smaller lesions) and she was diagnosed with MS. May 8th C and T spine MRIs done.  She also has a Chiari malformation. She was treated with Solumedrol for 5 days without taper.      Relapse today:  No  Most recent relapse date: April 2020    Other prior relapses:   - 4 years ago had episode of bilateral leg tingling  - between that episode and her ON she also had a episode of left arm sensory changes     Current DMT: Gilenya   Prior Medications used:    Current MS related symptoms unchanged and  include: tingling around menstrual cycle, fatigue    She denies cognitive changes, depression, anxiety, current visual changes, hearing loss, tinnitus, vertigo, dysarthria/dysphagia, spasticity, weakness, numbness,  incoordination, imbalance, falls, tremor, bowel/bladder dysfunction, headache, Lhermittes sign, Uthoffs phenomenon.     Walking is unrestricted.   She is trying to exercise despite pandemic [place]      Other notable symptoms include: none    A 14 point review of systems is unchanged, which I reviewed and was otherwise remarkable for: no CV-19 symptoms; spondylosis in L5    Review of Previous Records:  [facility] ophthlamology:  HVF - paracentral visual field deficit OS  MRI Brain 10/05/18 demonstrates Dawson fingers and infratentorial/cerebellar lesions by report    Past Medical History is notable for   New diagnosis of MS    Family history: There is no family history of multiple sclerosis. Family history is otherwise notable for  Father has ITP, MGUS and diabetes type 2  Mom has melanoma    Social history: The patient is married and her husband is active duty in Therapist, art and is not deployed.  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  There is no history of drug or alcohol abuse. She  reports that she has never smoked. She has never used smokeless tobacco.    Medications:  Birth control pill    Allergies: No Known Allergies    Physical examination: Video  There were no vitals taken for this visit.     Physical examination:    Telemedicine exam today, see prior in person  exam below  Ductions full, normal smooth pursuit, normal saccades  Face symmetric  Hearing intact  Tongue midline  Shoulder shrug present BL  Moving all limbs symmetrically, able to do squat unassisted  Normal casual gait, normal base, normal arm swing  Able to do toe, heel and tandem walk  Able to hop at least 5 times in each foot    ----  Cardiovascular exam: No lower extremity edema.  Pedal pulses present bilaterally.    On neurological examination, the patient is alert and oriented to person, place and time. Cognition is grossly intact with normal language and short-term memory.      Cranial  Nerves:  Snellen acuity equivalent to near card was 20/20 bilaterally. She identified 6/6 color plates correctly with right eye and 6/6 with the left.  Pupils are equal round and reactive to light, with no afferent pupillary defect. Undilated fundus exam was notable for very subtle temporal rim pallor. Visual fields were full to confrontation.  Alignment was orthophoric and extraocular movements were intact without nystagmus. Facial sensation was intact to light touch, pinprick and temperature in all three branches of the trigeminal nerve bilaterally. Facial strength was full and symmetric. Hearing was grossly intact. The palate elevated  evenly. The trapezius and sternocleidomastoid strength is full bilaterally. The tongue was midline and rapid alternating movements of the tongue were normal.       Motor Exam:   Pronator Drift: absent   Bulk: normal bulk   Tone: normal tone in all limbs   Fine finger movements were fast and symmetric.   Foot taps were fast and symmetric.   Power: Strength full (5/5) to confrontation with the following exceptions: None    Reflexes:    Right  Left    Biceps  2+  2+    Triceps  2+  2+    Brachioradialis  2+ 2+   Knee  2+  2+    Ankle  2+  2+    Plantar  flexor  flexor      Sensory Exam:   Light Touch  Normal    Pain  Normal    Temperature  Normal    Vibration  Normal    Proprioception  Normal        Coordination and Gait:  There is normal finger-nose-finger, heel-knee-shin and rapid alternating movements. There is no dysdiadochokinesia. The gait is normal, narrow-based and with normal heel, toe and tandem walk. Romberg sign was negative.         The EDSS score was 1 with the following functional scale scores: visual 1, brainstem 0, pyramidal 0, sensory 0, cerebellar 0, bowel and bladder 0, cerebral 0.      9-Hole PEG Test 03/09/2019   RUE  17.88   RUE (Best) 17.25   LUE 21.38   LUE (Best) 19.46       25 Ft. Ambulation Time 03/09/2019   25 ft Ambulation Time - 1st Trial (Seconds) 4.16    Independently or With Assistance? Independently   25 ft Ambulation Time - 2nd Trial (Seconds) 3.83   Independently or With Assistance? Independently       No flowsheet data found.    No flowsheet data found.    No flowsheet data found.    No flowsheet data found.    Labs 04/12/2020  WBC 4.6  Lymphocytes 0.6 (L)  BMP nl  LFT AST 15 ALT 19      Imaging: The following imaging studies were reviewed in detail with the patient (images available for my review today)    01/26/20 brain and spinal cord MRIs:   Stable lesions at C3-4, C5  Brain overall stable and no enchancement but one small new T2 lesions compared to 05/19/19 scan.       05/19/19 [facility] MRIs brain and C spine  - I did not get report. Patient was told she had a new or partially enhancing lesion.  - Some artifact in C spine images but has lesion in posterior cord at C5 (same as prior).  Also seen is lesion I noted prior at C3-4. No enhancement in cervical cord.  - brain axial T2 images and sagital flair images demonstrate >20 lesions consistent in location and appearance with MS.  She has multiple T1 hypointense lesions. I did not appreciate a clearly enhancing lesion on the multiple views provided.      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 >10 lesions consistent in appearance and location with MS    Cervical Spine and Thoracic MRI in [facility] hospital records report indicates a lesion at C5; by my review also lesion C2-4 and likely partial cord lesion mid-thoracic cord.  IMPRESSION:  As I discussed with the patient, Ms. Collister had acute right ON April 2020. Likely prior sensory relapses and her imaging is consistent with RRMS.     RECOMMENDATIONS:  1.  Disease modifying therapy or Diagnostic testing:  Gilenya continued for now, patient aware to inform us when she wishes to try to become pregnant. At that time will switch to Ocrevus.     2.  Symptom management:  Can  continue to try diet and exercise  Continue acylcarnitine, Co Q 10, fish oil.  Does not want new prescription medication for fatigue.     3.  Physical therapy and/or exercise:  Continue regular exercise    4.  Diet and vitamins:  Take daily vitamin D3  2000   IU  Healthy well balanced diet.      5.  Next MRI due:  Repeat MRI Brain June 2022    6.  Next labs due:  6 months    7.  Next vision exam due:  OCT  with next visit    8.  Other testing, recommendations, or consults:    9.  Follow-up in person or video  in 6 months    Education Provided: medication management of MS; covid-19 and MS    Resources for support groups and patient education materials can be found at  [facility].[email or web address]     MS Disease Modifying Therapies (DMT)  [email or web address]://[email or web address]    Wellness in [place]  [email or web address]      Research:    Please find below contact information for our MS team.  Please also be aware, since we are with patients in clinic all day, response times may be affected.    For scheduling, changing, or cancelling appointments, please call [phone] or [phone]    For MS medication questions, please contact or Pharmacist, Dr. Bosie Clos via [place] or [phone]    For new symptoms, clinical questions, or other nursing related concerns, please contact:         Prudencio Pair, RN for Dr. Berenice Primas via MyChart or [phone]       Sheran Lawless, RN for Dr. Rosendo Gros via [place] or [phone]    If unable to reach me directly you may call the Call Center to get a message to our team at [phone]     To speak with one of our Research Coordinators with questions pertaining to research studies, please contact [place] or Alda Berthold at [phone]    As a backup plan only, if you have difficulty reaching [place]  through the above mechanisms, you may email Dr. Berenice Primas at [email or web address] for non-urgent matters.  Please note that email is not appropriate for urgent matters and MyChart is a preferred, secure messaging system.        Patient is seen and discussed with the attending Dr. Jeanell Sparrow, MD  Neurology Resident PGY-4

## 2020-07-16 ENCOUNTER — Encounter (INDEPENDENT_AMBULATORY_CARE_PROVIDER_SITE_OTHER): Payer: Self-pay | Admitting: Neurology

## 2020-07-16 NOTE — Telephone Encounter (Signed)
Pt called in to follow up on my chart message sent today. Pt would kindly like a call back from MD or nurse. Please advise.     [phone]  Routed as high priority per callers request

## 2020-08-28 ENCOUNTER — Telehealth (INDEPENDENT_AMBULATORY_CARE_PROVIDER_SITE_OTHER): Payer: Self-pay | Admitting: Neurology

## 2020-08-28 NOTE — Telephone Encounter (Signed)
Caller: Pt  Relationship to patient: self  Phone # [phone]  Preferred Method of Communication: mychart  Notes:     Pt is calling regarding MRI order that is needed for her brain. She would like this order to be placed and sent to:    [facility], [address], [place], [place] [address]    Please advise and notify pt on mychart once placed.    Caller has been advised of 72 hr turnaround time.

## 2020-09-02 ENCOUNTER — Encounter (INDEPENDENT_AMBULATORY_CARE_PROVIDER_SITE_OTHER): Payer: Self-pay

## 2020-09-02 NOTE — Telephone Encounter (Signed)
RN sent the patient a MyChart message noting the request to fax orders to [facility] was received. RN requested the patient provide the correct fax number to where the orders need to be faxed.

## 2020-09-03 NOTE — Telephone Encounter (Signed)
RN faxed MRI orders via Epic fax to [facility] at [phone]

## 2020-09-06 NOTE — Telephone Encounter (Signed)
RN faxed MRI orders via Epic fax to [facility] at [phone]

## 2020-10-09 ENCOUNTER — Encounter (INDEPENDENT_AMBULATORY_CARE_PROVIDER_SITE_OTHER): Payer: Self-pay

## 2020-10-09 DIAGNOSIS — G35 Multiple sclerosis: Secondary | ICD-10-CM

## 2020-10-09 MED ORDER — GILENYA 0.5 MG PO CAPS
0.5000 mg | ORAL_CAPSULE | Freq: Every day | ORAL | 1 refills | Status: DC
Start: 2020-10-09 — End: 2021-01-10

## 2020-10-09 NOTE — Telephone Encounter (Signed)
Medication refill requested.  Chart reviewed. Labs completed 04/12/20 and scanned into media: CBC as expected, wnl except ALC 0.6, CMP wnl. Last seen by Dr. Luiz Blare on 05/23/20.  Plan at that time was to continue Gilenya for now, patient aware to inform us when she wishes to try to become pregnant. At that time will switch to Ocrevus.     Medication refilled as continuation of therapy.      1. Multiple sclerosis (CMS-HCC)    - Fingolimod HCl (GILENYA) 0.5 MG CAPS; Take 1 capsule (0.5 mg) by mouth daily.  Dispense: 90 capsule; Refill: 1        Jasmine Schneider, PHARMD, BCPS, NCTTP

## 2020-11-28 ENCOUNTER — Telehealth (INDEPENDENT_AMBULATORY_CARE_PROVIDER_SITE_OTHER): Payer: TRICARE Prime—HMO | Admitting: Neurology

## 2021-01-08 ENCOUNTER — Encounter (INDEPENDENT_AMBULATORY_CARE_PROVIDER_SITE_OTHER): Payer: Self-pay | Admitting: Neurology

## 2021-01-08 DIAGNOSIS — G35 Multiple sclerosis: Secondary | ICD-10-CM

## 2021-01-10 MED ORDER — GILENYA 0.5 MG PO CAPS
0.5000 mg | ORAL_CAPSULE | Freq: Every day | ORAL | 0 refills | Status: AC
Start: 2021-01-10 — End: ?

## 2021-01-10 NOTE — Telephone Encounter (Signed)
Spoke to [place].     She recently was able to see her new PCM in [place]. They are working on getting her in to a neurologist.     In the meantime, will supply 3 month one time supply to [facility] to give her time to establish care.     She will send [place] more recent lab results to review.

## 2021-01-10 NOTE — Telephone Encounter (Signed)
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 They suggested I contact you and ask for this prescription so that they can send the medicine out to me.   Thanks in advance,  Jasmine Schneider

## 2021-01-23 ENCOUNTER — Encounter (INDEPENDENT_AMBULATORY_CARE_PROVIDER_SITE_OTHER): Payer: TRICARE Prime—HMO | Admitting: Neurology

## 2021-02-03 IMAGING — MR MRI ANKLE LT WO/W CONTRAST
8 of 9 series · 37 of 40 positions shown · IV contrast (prohance)
Comparison: None.

HISTORY: Ganglion cyst of left ankle and foot
TECHNIQUE: Multiplanar and multisequence MR imaging of the left ankle was performed prior to and following the administration of 15 mL ProHance intravenous contrast.

[Series 3: t1_axial · axial · left · 3.0mm · 0.42mm/px · z∈[-66,+64]mm · 5 of 34 slices shown]
[im 1/34]
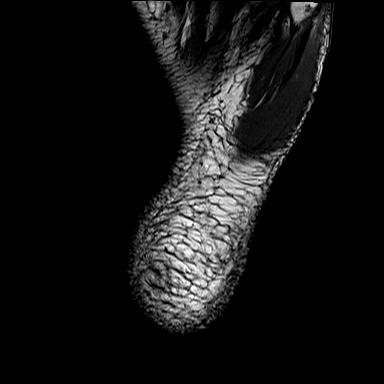
[im 9/34]
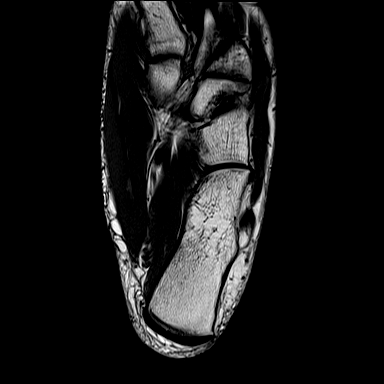
[im 17/34]
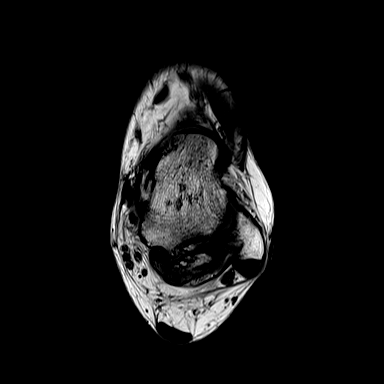
[im 25/34]
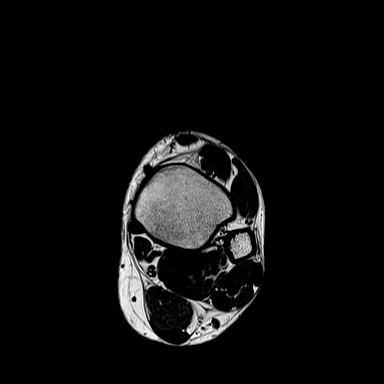
[im 34/34]
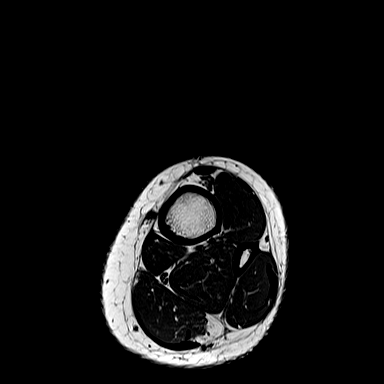

[Series 4: t2_axial_fs · axial · left · 3.0mm · 0.53mm/px · z∈[-66,+64]mm · 5 of 34 slices shown]
[im 1/34]
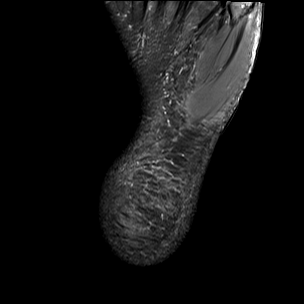
[im 9/34]
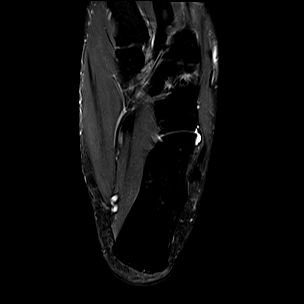
[im 17/34]
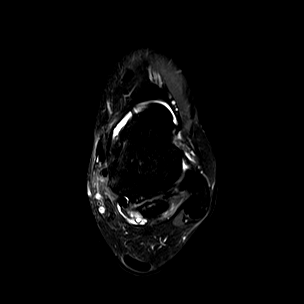
[im 25/34]
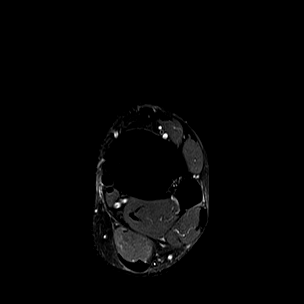
[im 34/34]
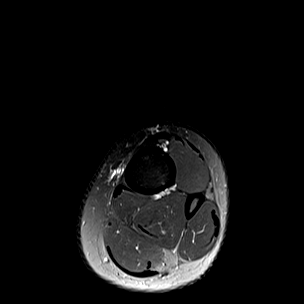

[Series 5: t2_cor_fs · coronal · left · 3.0mm · 0.47mm/px · 5 of 34 slices shown]
[im 1/34]
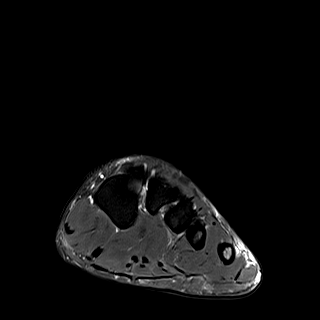
[im 9/34]
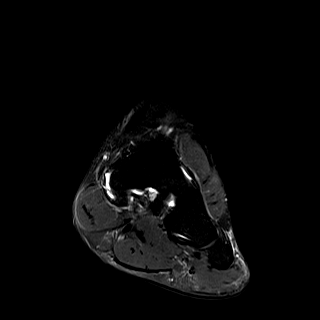
[im 17/34]
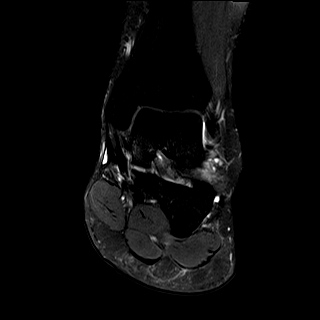
[im 25/34]
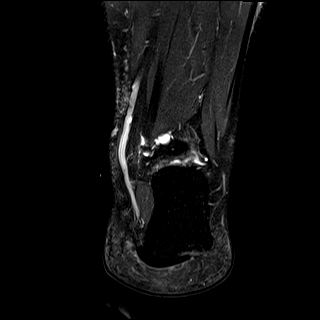
[im 34/34]
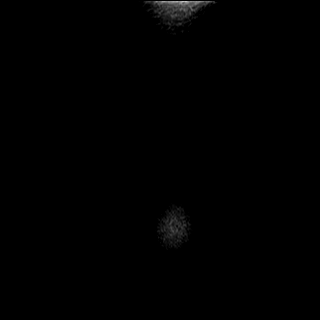

[Series 6: t2_sag_fs · sagittal · left · 3.0mm · 0.45mm/px · 3 of 20 slices shown]
[im 1/20]
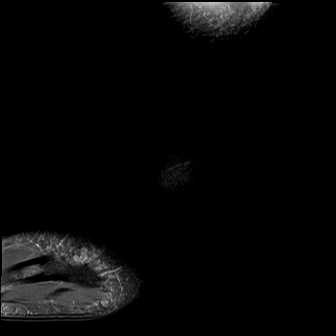
[im 10/20]
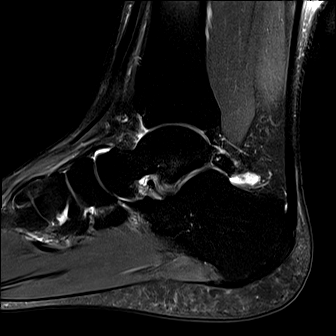
[im 20/20]
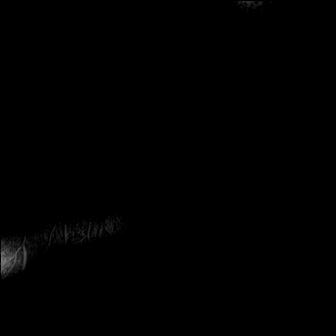

[Series 7: t1_sag · sagittal · left · 3.0mm · 0.33mm/px · 3 of 20 slices shown]
[im 1/20]
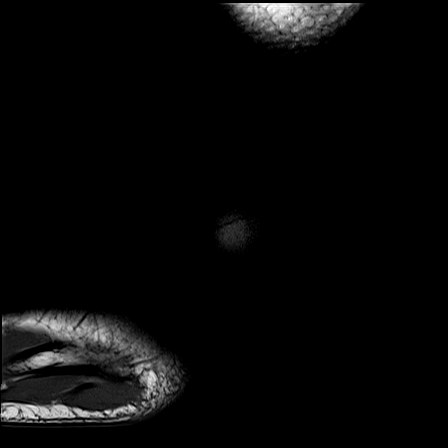
[im 10/20]
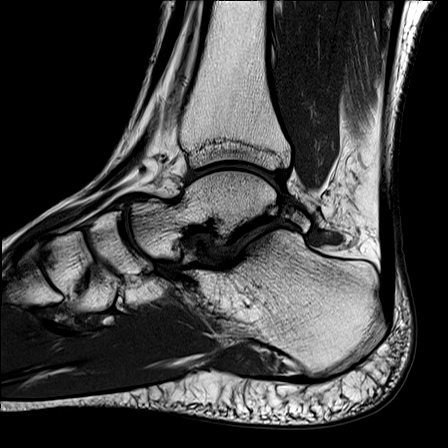
[im 20/20]
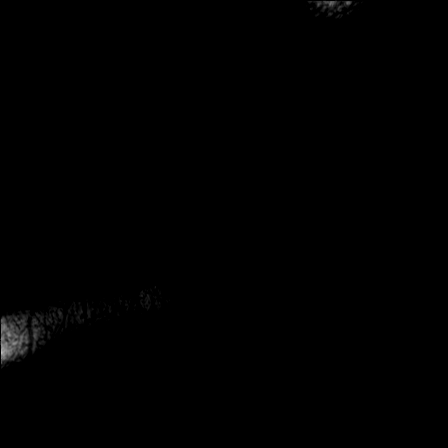

[Series 8: pd_axial_obli_fs · axial · left · 3.0mm · 0.49mm/px · z∈[-10,+41]mm · 6 of 38 slices shown]
[im 1/38]
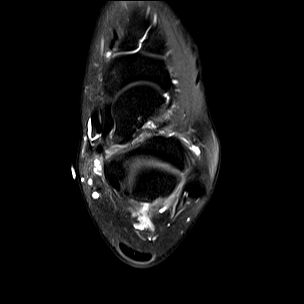
[im 8/38]
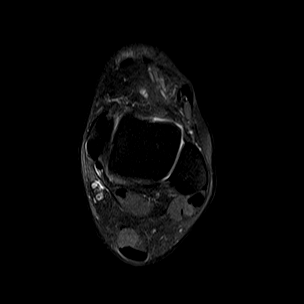
[im 15/38]
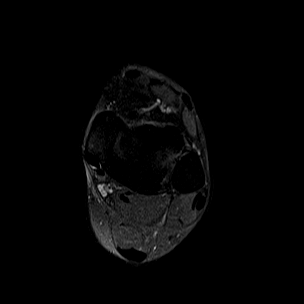
[im 23/38]
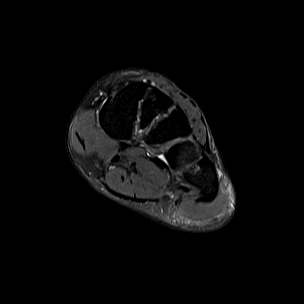
[im 30/38]
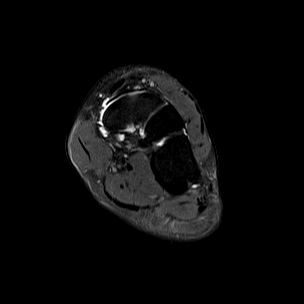
[im 38/38]
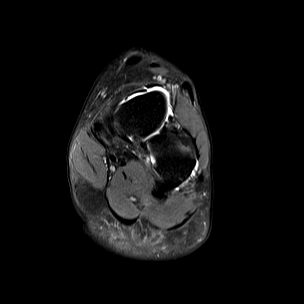

[Series 11: t1_axial_fs · axial · left · 3.0mm · 0.55mm/px · z∈[-65,+65]mm · 5 of 34 slices shown]
[im 1/34]
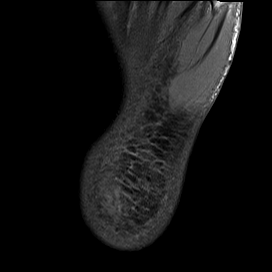
[im 9/34]
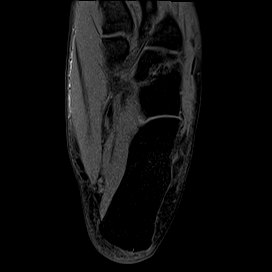
[im 17/34]
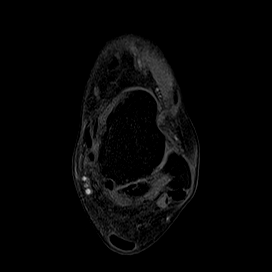
[im 25/34]
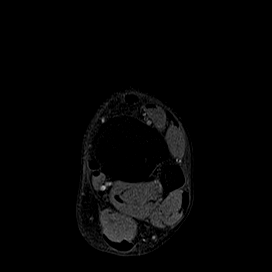
[im 34/34]
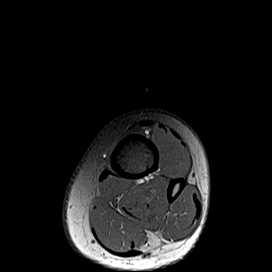

[Series 12: t1_axial_fs+c · axial · left · 3.0mm · 0.55mm/px · z∈[-65,+65]mm · 5 of 34 slices shown]
[im 1/34]
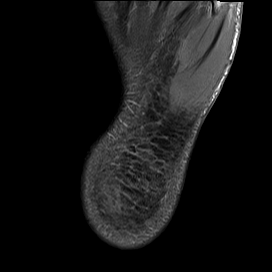
[im 9/34]
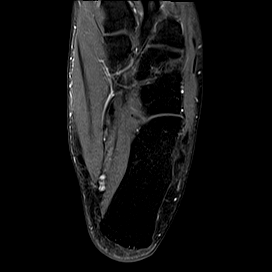
[im 17/34]
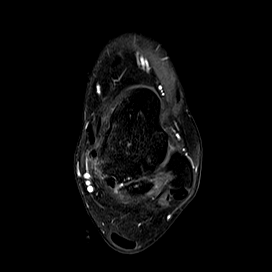
[im 25/34]
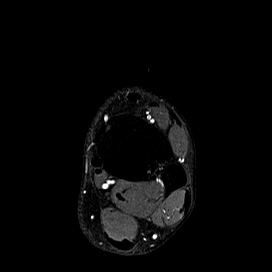
[im 34/34]
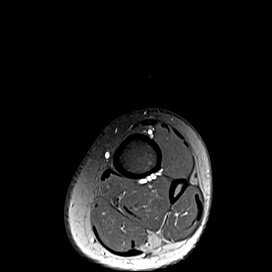

[37 of 40 positions shown; findings below may reference images not displayed]

FINDINGS: Achilles tendon maintains normal signal intensity and normal biconcave shape.

Peroneal tendons are intact and unremarkable. Medial flexor tendons demonstrate nothing unusual.

Extensor tendons are normal.

The anterior and posterior syndesmotic ligaments and anterior and posterior talofibular ligaments are unremarkable. The calcaneofibular ligament is intact. The deltoid ligament is also intact. Spring ligament normal.

The plantar fascia is unremarkable. 

Fluid and/or scar within the subtalar space. Correlate for sinus tarsi syndrome.

The talar dome appears normal. The articular cartilage of the tibiotalar joint is intact. There is no osteochondral lesion. There is no tibiotalar joint effusion.

There is moderate DJD of the middle talocalcaneal facet. Associated reactive subcortical marrow edema involving the talus. Findings likely posttraumatic in nature. No visible tarsal coalition. Small multiseptated ganglion cyst along the medial aspect of the middle talocalcaneal facet.

Mild DJD posterior talocalcaneal facet.

Remaining visualized joint spaces demonstrate nothing unusual.

Osseous structures demonstrate no fractures or destructive lesions.

Visualized intrinsic foot musculature is unremarkable.

Postcontrast imaging demonstrates no abnormal synovial enhancement. No enhancing lesions. Thin marginal enhancement of the ganglion cyst medial to the middle talocalcaneal facet.
IMPRESSION: 1.
Moderate DJD of the middle talocalcaneal facet with associated reactive subcortical marrow edema involving the talus. Findings likely posttraumatic in nature. No visible tarsal coalition. Small multiseptated ganglion cyst along the medial aspect of the middle talocalcaneal facet. Mild DJD posterior talocalcaneal facet.

2.
Fluid and/or scar within the subtalar space. Correlate for sinus tarsi syndrome.

## 2021-03-04 MED ORDER — NORTREL 0.5/35 (28) 0.5-35 MG-MCG OR TABS
1.00 | ORAL_TABLET | Freq: Every day | ORAL | Status: AC
Start: 2020-12-30 — End: ?

## 2021-03-04 NOTE — Progress Notes (Deleted)
Neurology Clinic Pharmacy Note:    Date:  03/04/21   S: Jasmine Schneider is a 25 year old female presenting to clinic for RRMS. Last seen via HH/Telemedicine 05/23/2020.      Medication reconciliation: Medication reconciliation was not performed by PharmD.   Vitamin D 5,000 units daily: no  Narcotics and who prescribes: n/a  Potential drug interactions: no major drug interactions found  Contraceptives: patient on Nortrel     Insurance coverage: [place] prime   Specialty pharmacy/Infusion [facility]    Orders/prescriptions up to date/adequate refills: no patient will require refills if she is to continue Gilenya     Drug specific safety monitoring:      CBC    Per labs from [facility] 04/12/2020 - ALC 0.6 - please see Media file.     Lab Results   Component Value Date    WBC 5.1 03/09/2019    RBC 4.46 03/09/2019    HGB 13.2 03/09/2019    HCT 40.7 03/09/2019    MCV 91.3 03/09/2019    MCHC 32.4 03/09/2019    RDW 12.6 03/09/2019    PLT 274 03/09/2019    MPV 9.7 03/09/2019    SEG 78 03/09/2019    ABSNEUTRO 4.0 03/09/2019    ABSLYMPHS 0.4 (L) 03/09/2019         CMP  Lab Results   Component Value Date    TBILI 0.29 03/09/2019    ALB 4.5 03/09/2019    TP 6.9 03/09/2019    AST 25 03/09/2019    ALK 57 03/09/2019    ALT 35 (H) 03/09/2019          Vaccinations:  Current Immunizations  Never Reviewed    No immunizations on file.        No Evusheld injection found in patient's record     Assessment/Plan:  1. Neurology: per Dr. Berenice Primas  2. Recommended labs: repeat CBC is due   3. Recommended vaccinations: update vaccination information

## 2021-03-04 NOTE — Progress Notes (Deleted)
MS Follow-up Note:    Primary care physician:  [facility], [facility]  [place] [address]    Referring physician  Leane Para, MD  [facility] [address]  [place], [place] [address]    I met with Jasmine Schneider in follow-up consultation today at the [facility].  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     Her husband is active duty Therapist, art and she is also followed at the [facility]. 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  When ready for pregnancy will need to change DMT.    Interval history:  Since the last visit, she has been having irregular menstrual bleeding. She is spotting mid-cycle. She is being evaluated by the PCP, with whom she also discussed increased acne. She has also been having hair loss, with notable shedding when showering or brushing her hair. She is on OCPs and being worked up for PCOS.    Her MS is stable, though her fatigue significantly affects her. She is still able to do the things she needs to do. She is taking recommended the fish oil, acetylcarnitine, co Q 10.. There has been some benefit, but symptoms are still present. No concern for flares. She had previously tried amantadine before, but after one week noticed no difference. She prefers taking a natural route.       Prior history:  First symptoms: April 2020 she had retro-orbital pain behind both eyes that then intensified on right side; then woke up with tinted vision in right eye (right ON).  First diagnosed as cluster headaches.  She ultimately saw ophthalmology and was diagnosed with optic neuritis.  On ROS at that visit she reported having tingling episodes in the past in her limbs.  Brain MRI April 30th showed lesions (3-4 bigger and smaller  lesions) and she was diagnosed with MS. May 8th C and T spine MRIs done.  She also has a Chiari malformation. She was treated with Solumedrol for 5 days without taper.      Relapse today:  No  Most recent relapse date: April 2020    Other prior relapses:   - 4 years ago had episode of bilateral leg tingling  - between that episode and her ON she also had a episode of left arm sensory changes     Current DMT: Gilenya   Prior Medications used:    Current MS related symptoms unchanged and  include: tingling around menstrual cycle, fatigue    She denies cognitive changes, depression, anxiety, current visual changes, hearing loss, tinnitus, vertigo, dysarthria/dysphagia, spasticity, weakness, numbness, incoordination, imbalance, falls, tremor, bowel/bladder dysfunction, headache, Lhermittes sign, Uthoffs phenomenon.     Walking is unrestricted.   She is trying to exercise despite pandemic [place]      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 Family history is otherwise notable for  Father has ITP, MGUS and diabetes type 2  Mom has melanoma    Social history: The patient is married and her husband is active duty in Therapist, art and is not deployed.  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  There is no history of drug or alcohol abuse. She  reports that she has never smoked. She has never used smokeless tobacco.    Medications:  Birth control pill    Allergies: No Known Allergies    Physical examination: Video  There were no vitals  taken for this visit.     Physical examination:    Telemedicine exam today, see prior in person  exam below  Ductions full, normal smooth pursuit, normal saccades  Face symmetric  Hearing intact  Tongue midline  Shoulder shrug present BL  Moving all limbs symmetrically, able to do squat unassisted  Normal casual gait, normal base, normal arm swing  Able to do toe, heel and tandem walk  Able to hop at least 5 times in each foot    ----  Cardiovascular exam: No lower extremity edema.  Pedal pulses present bilaterally.    On neurological examination, the patient is alert and oriented to person, place and time. Cognition is grossly intact with normal language and short-term memory.      Cranial Nerves:  Snellen acuity equivalent to near card was 20/20 bilaterally. She identified 6/6 color plates correctly with right eye and 6/6 with the left.  Pupils are equal round and reactive to light, with no afferent pupillary defect. Undilated fundus exam was notable for very subtle temporal rim pallor. Visual fields were full to confrontation.  Alignment was orthophoric and extraocular movements were intact without nystagmus. Facial sensation was intact to light touch, pinprick and temperature in all three branches of the trigeminal nerve bilaterally. Facial strength was full and symmetric. Hearing was grossly intact. The palate elevated evenly. The trapezius and sternocleidomastoid strength is full bilaterally. The tongue was midline and rapid alternating movements of the tongue were normal.       Motor Exam:   Pronator Drift: absent   Bulk: normal bulk   Tone: normal tone in all limbs   Fine finger movements were fast and symmetric.   Foot taps were fast and symmetric.   Power: Strength full (5/5) to confrontation with the following exceptions: None    Reflexes:    Right  Left    Biceps  2+  2+    Triceps  2+  2+    Brachioradialis  2+ 2+   Knee  2+  2+    Ankle  2+  2+    Plantar  flexor  flexor      Sensory Exam:   Light  Touch  Normal    Pain  Normal    Temperature  Normal    Vibration  Normal    Proprioception  Normal        Coordination and Gait:  There is normal finger-nose-finger, heel-knee-shin and rapid alternating movements. There is no dysdiadochokinesia. The gait is normal, narrow-based and with normal heel, toe and tandem walk. Romberg sign was negative.         The EDSS score was 1 with the following functional scale scores: visual 1, brainstem 0, pyramidal  0, sensory 0, cerebellar 0, bowel and bladder 0, cerebral 0.      9-Hole PEG Test 03/09/2019   RUE  17.88   RUE (Best) 17.25   LUE 21.38   LUE (Best) 19.46       25 Ft. Ambulation Time 03/09/2019   25 ft Ambulation Time - 1st Trial (Seconds) 4.16   Independently or With Assistance? Independently   25 ft Ambulation Time - 2nd Trial (Seconds) 3.83   Independently or With Assistance? Independently       No flowsheet data found.    No flowsheet data found.    No flowsheet data found.    No flowsheet data found.    Labs 04/12/2020  WBC 4.6  Lymphocytes 0.6 (L)  BMP nl  LFT AST 15 ALT 19    No visits with results within 6 Month(s) from this visit.   Latest known visit with results is:   Clinic Lab on 03/09/2019   Component Date Value Ref Range Status    Total Protein 03/09/2019 6.9  6.0 - 8.0 g/dL Final    Albumin 03/09/2019 4.5  3.5 - 5.2 g/dL Final    Bilirubin, Dir 03/09/2019 <0.2  <0.2 mg/dL Final    Bilirubin, Tot 03/09/2019 0.29  <1.2 mg/dL Final    AST (SGOT) 03/09/2019 25  0 - 32 U/L Final    ALT (SGPT) 03/09/2019 35 (A) 0 - 33 U/L Final    Alkaline Phos 03/09/2019 57  35 - 140 U/L Final    WBC 03/09/2019 5.1  4.0 - 10.0 1000/mm3 Final    RBC 03/09/2019 4.46  3.90 - 5.20 mill/mm3 Final    Hgb 03/09/2019 13.2  11.2 - 15.7 gm/dL Final    Hct 03/09/2019 40.7  34.0 - 45.0 % Final    MCV 03/09/2019 91.3  79.0 - 95.0 um3 Final    MCH 03/09/2019 29.6  26.0 - 32.0 pgm Final    MCHC 03/09/2019 32.4  32.0 - 36.0 g/dL Final    RDW 03/09/2019 12.6  12.0 - 14.0 %  Final    MPV 03/09/2019 9.7  9.4 - 12.4 fL Final    Plt Count 03/09/2019 274  140 - 370 1000/mm3 Final    Segs 03/09/2019 78  % Final    Lymphocytes 03/09/2019 9  % Final    Monocytes 03/09/2019 12  % Final    Eosinophils 03/09/2019 1  % Final    Basophils 03/09/2019 0  % Final    ANC-Automated 03/09/2019 4.0  1.6 - 7.0 1000/mm3 Final    Abs Lymphs 03/09/2019 0.4 (A) 0.8 - 3.1 1000/mm3 Final    Abs Monos 03/09/2019 0.6  0.2 - 0.8 1000/mm3 Final    Abs Eosinophils 03/09/2019 0.1  0.0 - 0.5 1000/mm3 Final    Abs Basophils 03/09/2019 0.0  0.0 1000/mm3 Final    Diff Type 03/09/2019 Automated   Final           Imaging: The following imaging studies were reviewed in detail with the patient (images available for my review today)      01/26/20 brain and spinal cord MRIs:   Stable lesions at C3-4, C5  Brain overall stable and no enchancement but one small new T2 lesions compared to 05/19/19 scan.       05/19/19 [facility] MRIs brain and C spine  - I did not get report. Patient was told she had a new or partially enhancing lesion.  - Some artifact in C spine images  but has lesion in posterior cord at C5 (same as prior).  Also seen is lesion I noted prior at C3-4. No enhancement in cervical cord.  - brain axial T2 images and sagital flair images demonstrate >20 lesions consistent in location and appearance with MS.  She has multiple T1 hypointense lesions. I did not appreciate a clearly enhancing lesion on the multiple views provided.      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 >10 lesions consistent in appearance and location with MS    Cervical Spine and Thoracic MRI in [facility] records report indicates a lesion at C5; by my review also lesion C2-4 and likely partial cord lesion mid-thoracic cord.           IMPRESSION:  As I discussed with the patient, Jasmine Schneider had acute right ON April 2020. Likely prior sensory relapses and her  imaging is consistent with RRMS.     RECOMMENDATIONS:  1.  Disease modifying therapy or Diagnostic testing:  Gilenya continued for now, patient aware to inform us when she wishes to try to become pregnant. At that time will switch to Ocrevus.     2.  Symptom management:  Can continue to try diet and exercise  Continue acylcarnitine, Co Q 10, fish oil.  Does not want new prescription medication for fatigue.     3.  Physical therapy and/or exercise:  Continue regular exercise    4.  Diet and vitamins:  Take daily vitamin D3  2000   IU  Healthy well balanced diet.      5.  Next MRI due:  Repeat MRI Brain and C spine are due now at [facility]    6.  Next labs due:  Every 6 months - CBC with diff, LFTs    7.  Next vision exam due:  OCT  with next visit    8.  Other testing, recommendations, or consults:    9.  Follow-up in person or video  in 6 months    Education Provided: medication management of MS; covid-19 and MS    Resources for support groups and patient education materials can be found at  [place].org     MS Disease Modifying Therapies (DMT)  [email or web address]-[email or web address]    Wellness in [place]  [email or web address]      Research:    Please find below contact information for our MS team.  Please also be aware, since we are with patients in clinic all day, response times may be affected.    For scheduling, changing, or cancelling appointments, please call [phone] or [phone]    For MS medication questions, please contact or Pharmacist, Dr. Bosie Clos via [place] or [phone]    For new symptoms, clinical questions, or other nursing related concerns, please contact:         Prudencio Pair, RN for Dr. Berenice Primas via MyChart or [phone]       Sheran Lawless, RN for Dr. Rosendo Gros via [place] or  [phone]    If unable to reach me directly you may call the Call Center to get a message to our team at [phone]     To speak with one of our Research Coordinators with questions pertaining to research studies, please contact [place] or Alda Berthold at [phone]    As a backup plan only, if you have difficulty reaching [place] through the above mechanisms, you may email Dr. Berenice Primas at [email or web address] for non-urgent matters.  Please  note that email is not appropriate for urgent matters and MyChart is a preferred, secure messaging system.

## 2021-03-05 ENCOUNTER — Encounter (INDEPENDENT_AMBULATORY_CARE_PROVIDER_SITE_OTHER): Payer: TRICARE Prime—HMO | Admitting: Neurology

## 2022-05-13 IMAGING — CR C-SPINE 4 or 5 views
1 series · 5 of 5 positions shown · non-contrast
Comparison: None

Images Obtained from [facility]
Cervical spine radiographs, 5 views
INDICATION: Cervicalgia

[Series 1: oblique · 0.17mm/px · 5 of 5 slices shown]
[im 1/5]
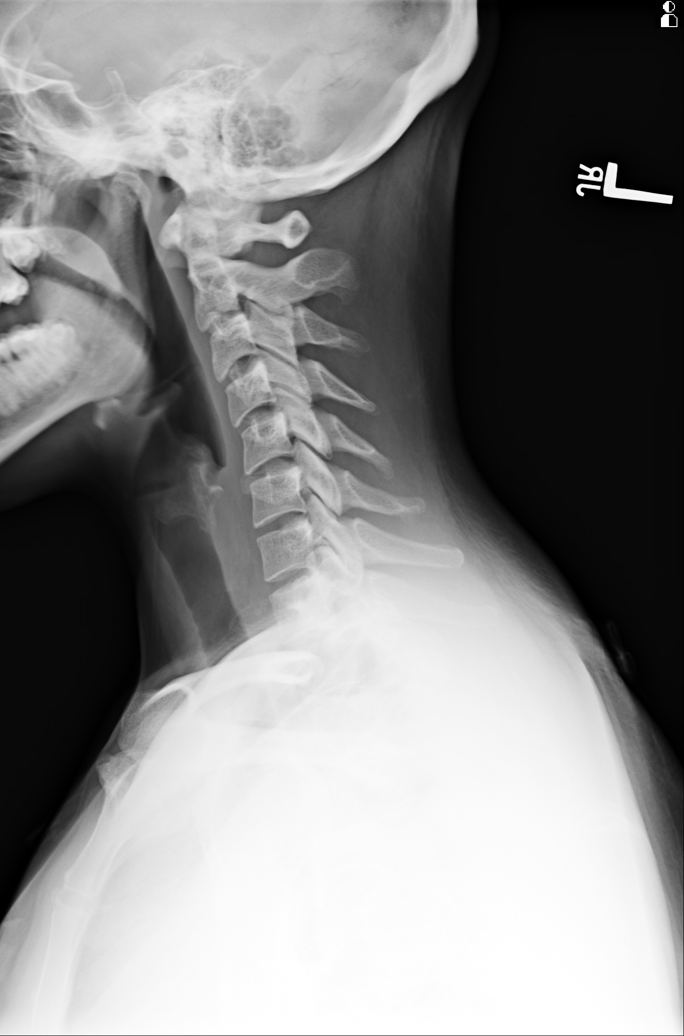
[im 2/5]
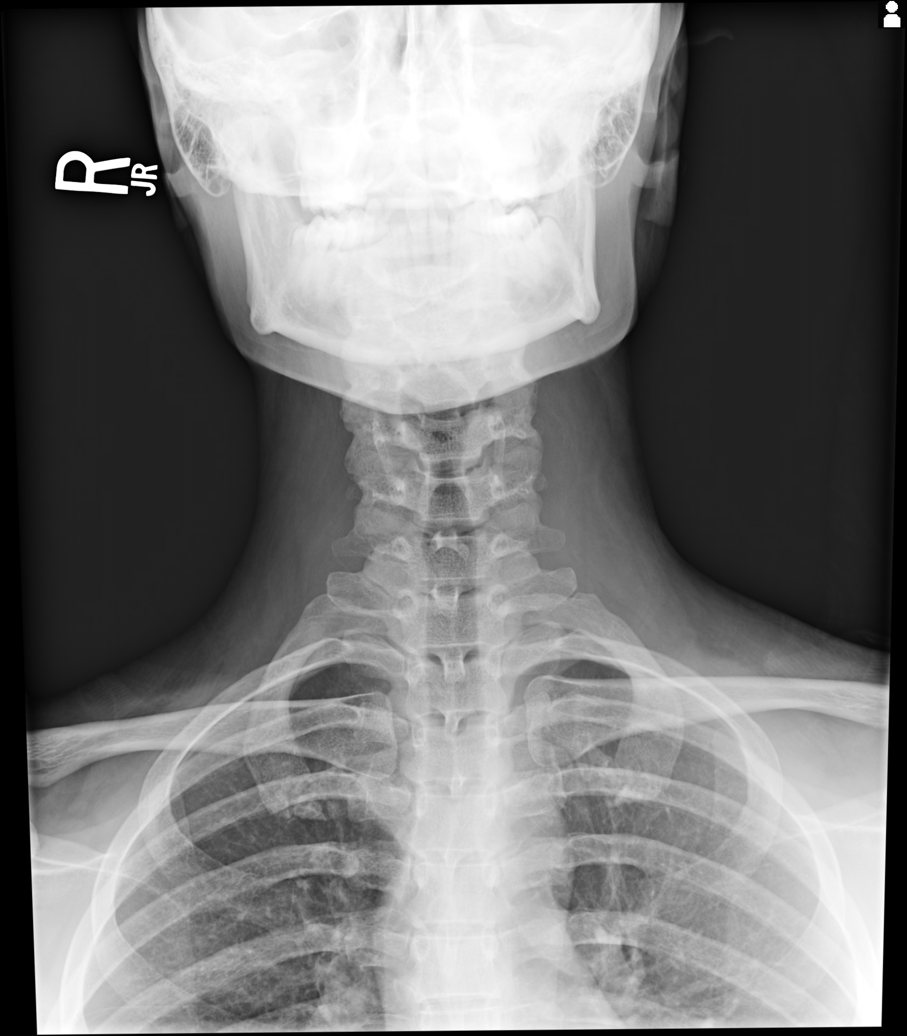
[im 3/5]
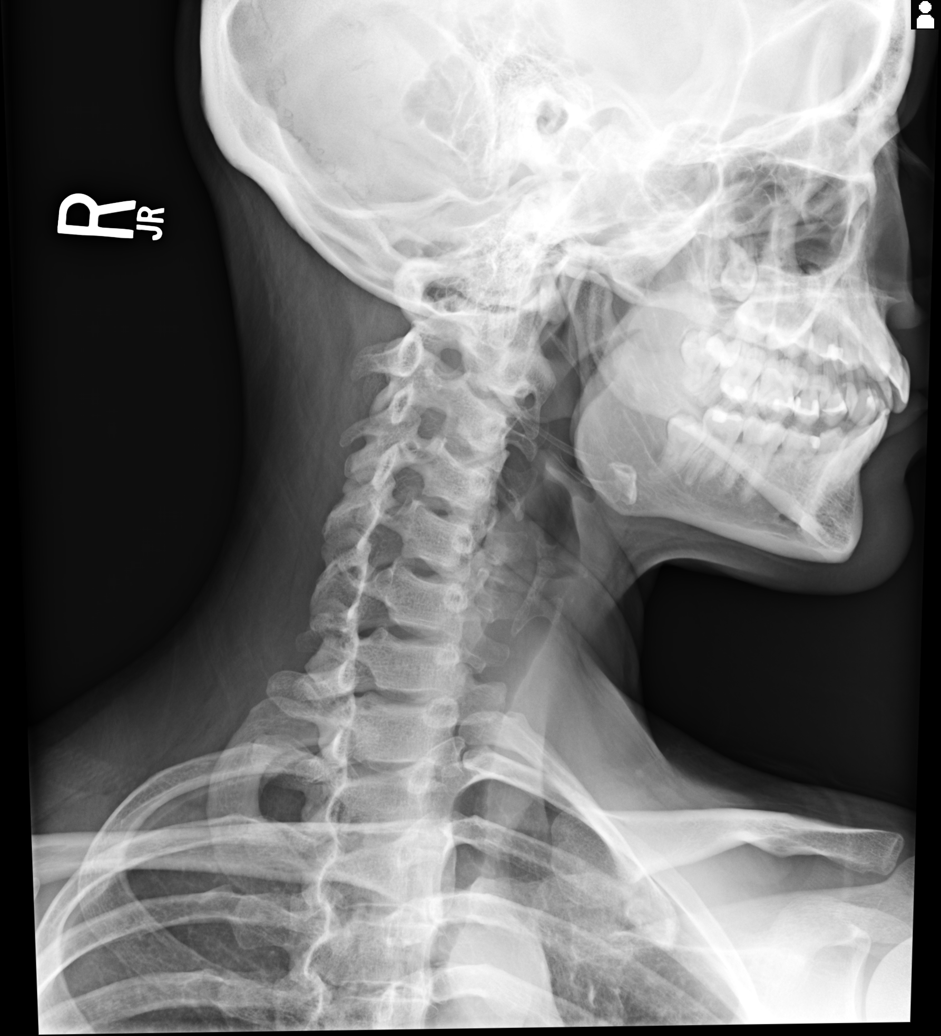
[im 4/5]
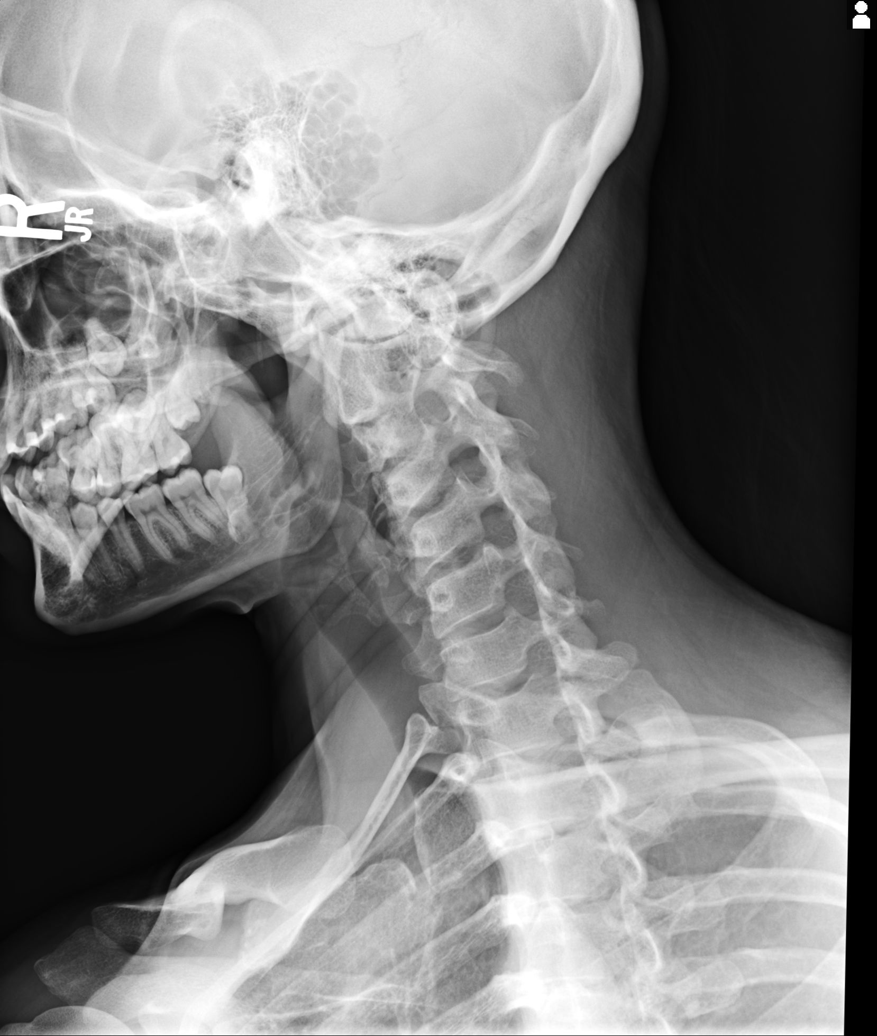
[im 5/5]
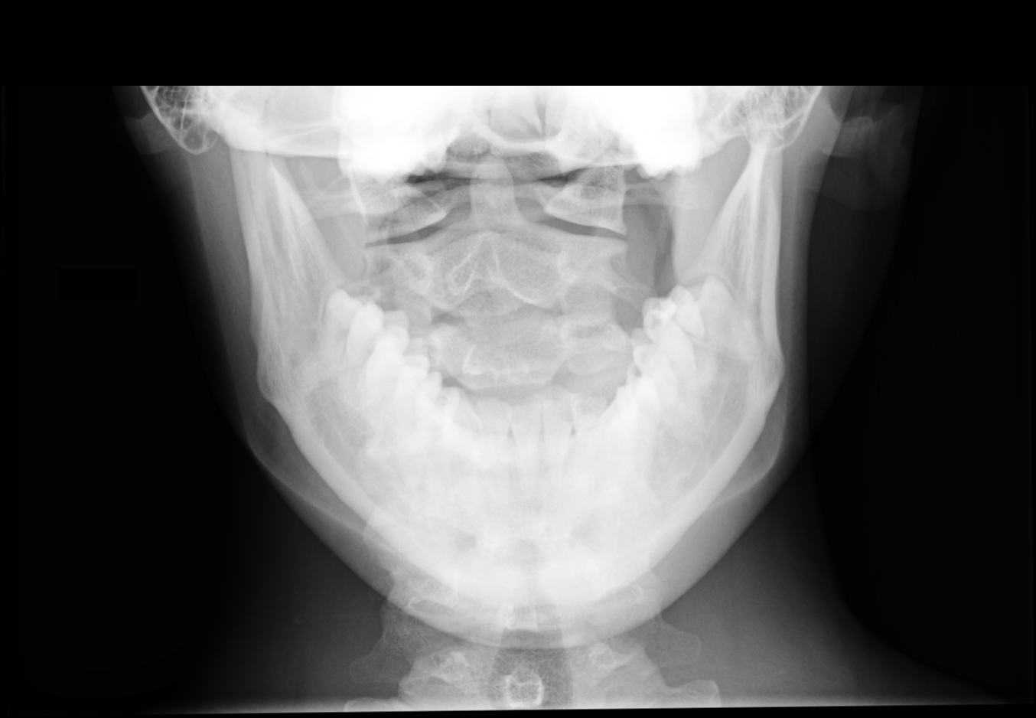

[5 of 5 positions shown; findings below may reference images not displayed]

FINDINGS: No acute fracture or dislocation. Disc heights are intact. No facet arthrosis. Prevertebral soft tissues are unremarkable.
IMPRESSION: Unremarkable cervical spine radiographs.

## 2022-07-15 ENCOUNTER — Encounter (INDEPENDENT_AMBULATORY_CARE_PROVIDER_SITE_OTHER): Payer: Self-pay

## 2022-07-20 IMAGING — MR MRI BRAIN W/WO CONTRAST
7 of 14 series · 23 of 48 positions shown · IV contrast (15cc prohance)
Comparison: MRI cervical spine 07/20/22

Images Obtained from [facility]
INDICATION: 26 years-old Female with enlarged lymph nodes, multiple sclerosis.
TECHNIQUE: Multiplanar, multisequence MRI of the brain was performed without and with intravenous contrast. The patient received an intravenous dose of 15 mL ProHance.

[Series 6: FLAIR · axial · 1.0mm · 0.56mm/px · z∈[+32,+191]mm · 5 of 160 slices shown (1 of 2)]
[im 1/160]
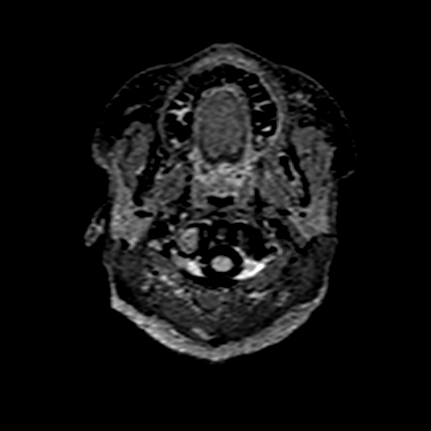
[im 40/160]
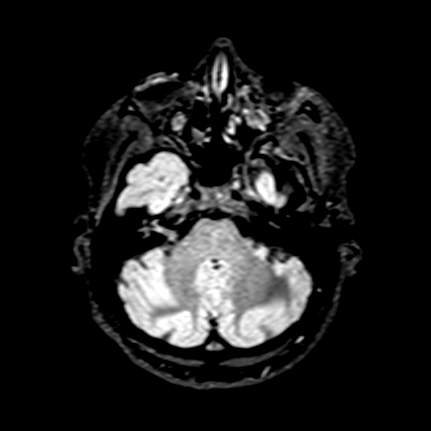
[im 80/160]
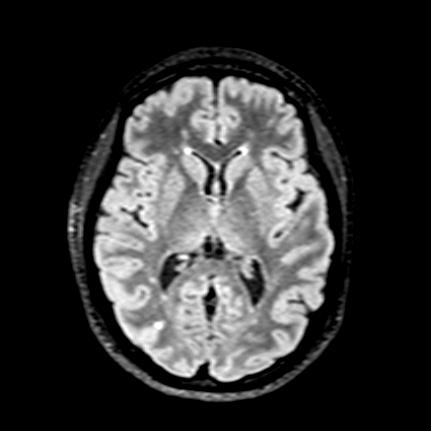
[im 120/160]
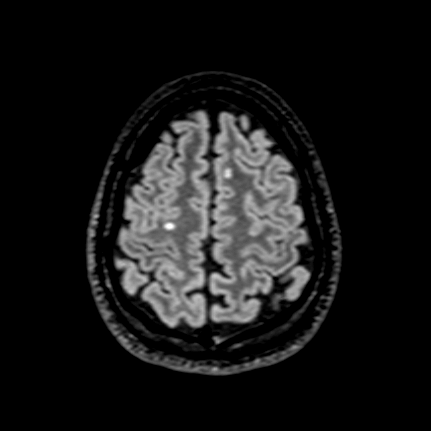
[im 160/160]
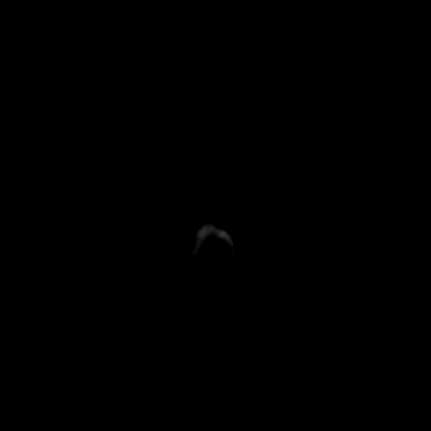

[Series 7: FLAIR · coronal · 1.0mm · 0.56mm/px · 5 of 180 slices shown (2 of 2)]
[im 1/180]
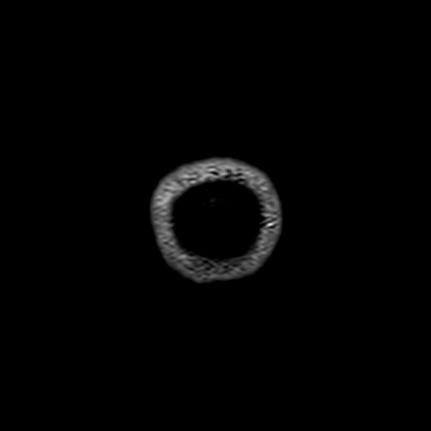
[im 45/180]
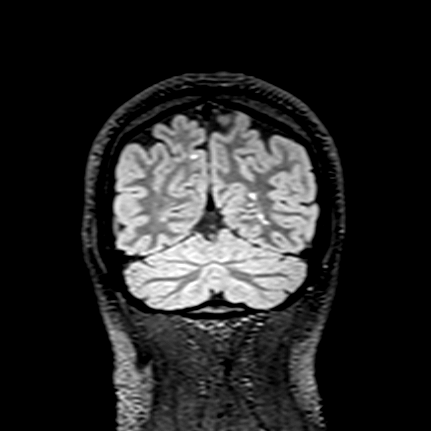
[im 90/180]
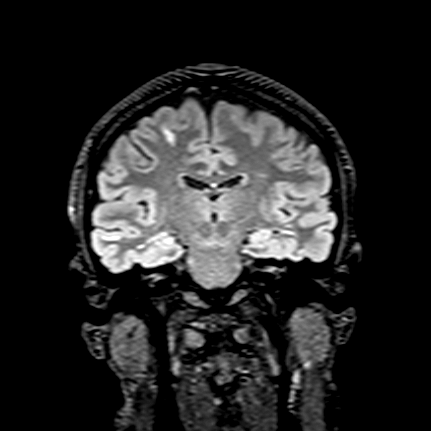
[im 135/180]
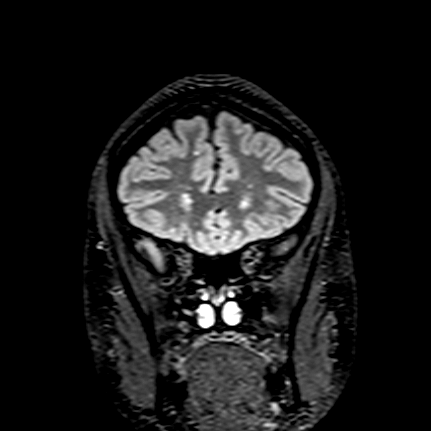
[im 180/180]
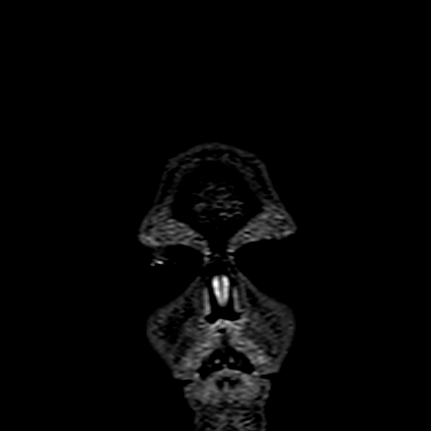

[Series 10: DWI · axial · 4.0mm · 0.65mm/px · 1 of 30 slices shown (1 of 2)]
[im 1/30]
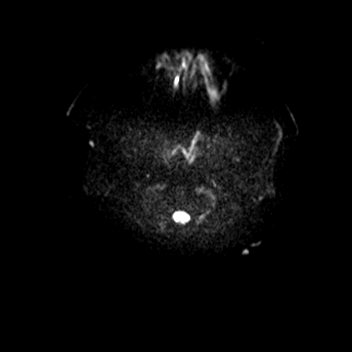

[Series 11: DWI · axial · 4.0mm · 0.65mm/px · 1 of 30 slices shown (2 of 2)]
[im 1/30]
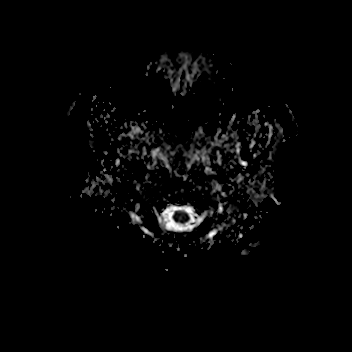

[Series 14: FLAIR fat-sat · axial · 1.0mm · 0.78mm/px · z∈[+41,+184]mm · 4 of 144 slices shown (1 of 2)]
[im 1/144]
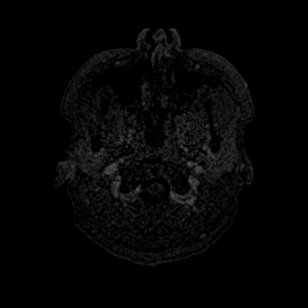
[im 48/144]
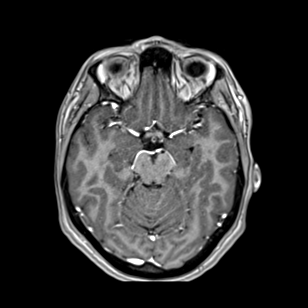
[im 96/144]
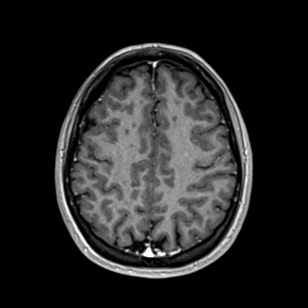
[im 144/144]
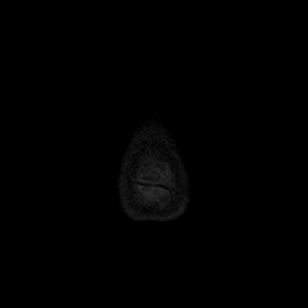

[Series 17: FLAIR fat-sat · coronal · 1.0mm · 0.78mm/px · 5 of 180 slices shown (2 of 2)]
[im 1/180]
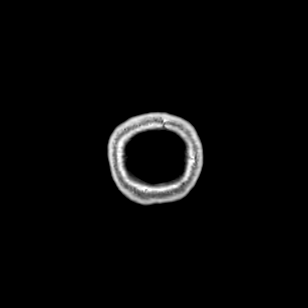
[im 45/180]
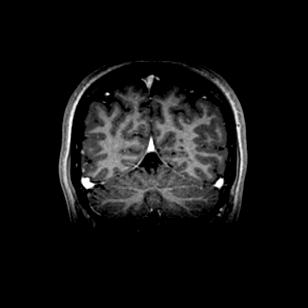
[im 90/180]
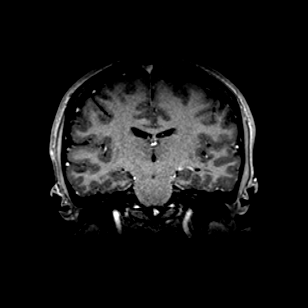
[im 135/180]
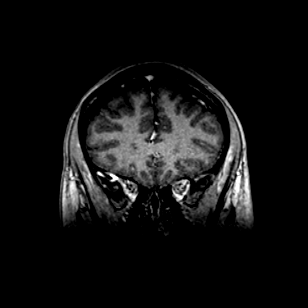
[im 180/180]
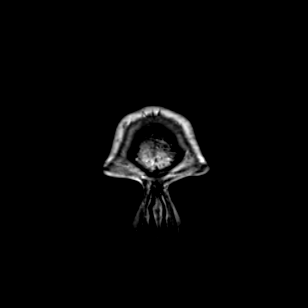

[Series 1001: T1 · sagittal · 0.8mm · 0.20mm/px · 2 of 206 slices shown]
[im 1/206]
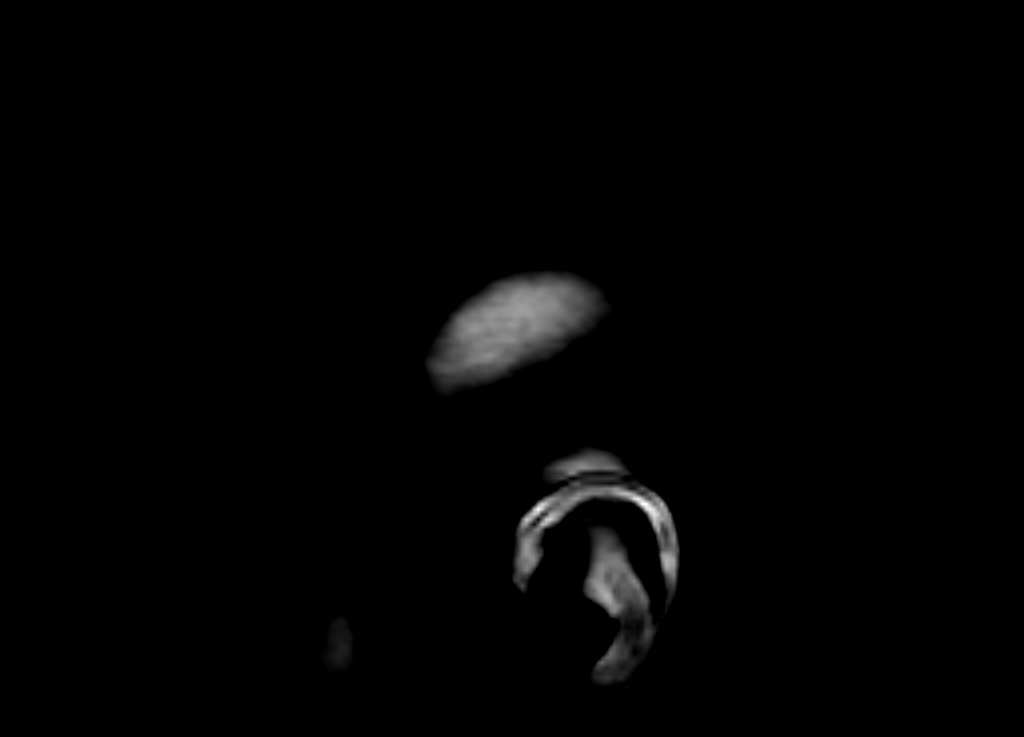
[im 42/206]
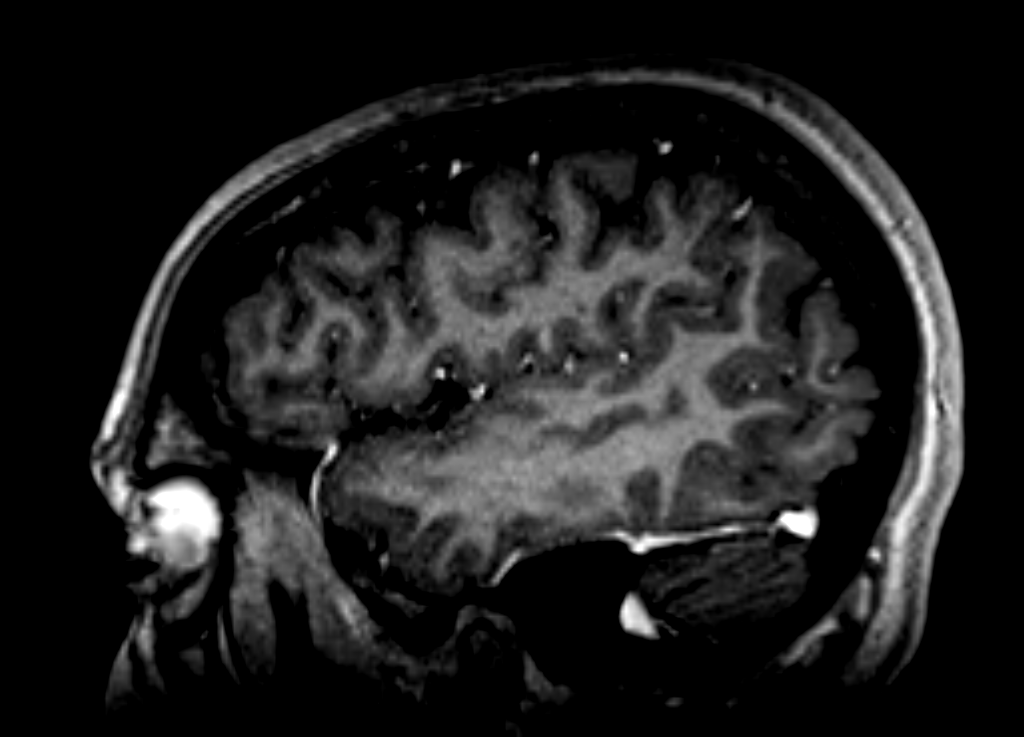

[23 of 48 positions shown; findings below may reference images not displayed]

FINDINGS: Brain parenchyma: No acute infarct or hemorrhage. Multiple juxtacortical/subcortical and periventricular white matter and a few posterior fossa are T2/FLAIR hyperintense lesions suggestive of
demyelination. A single posterior medial right parietal lobe lesion demonstrates T1 hypointense signal suggesting chronic axonal loss ([DATE]). No abnormal contrast-enhancement to suggest active
demyelination. Mild generalized parenchymal volume loss.
Ventricles: Mild cerebellar tonsillar ectopia by 2 to 3 mm, which does not meet criteria for Chiari malformation. No hydrocephalus.
Extra-axial spaces: No extra-axial fluid collection.
Extracranial structures: Paranasal sinuses, mastoid air cells and middle ear cavities without significant disease. Marrow signal normal. Orbits unremarkable. Soft tissues normal.
IMPRESSION: 1.  Multiple supratentorial and infratentorial white matter lesions suggestive of demyelination. No evidence of active demyelination. Mild generalized parenchymal volume loss.
2.  Mild cerebellar tonsillar ectopia, which does not meet criteria for Chiari malformation.

## 2023-01-25 IMAGING — MR MRI NECK SOFT [PERSON_NAME]/[PERSON_NAME] CONTRAST
9 of 11 series · 37 of 48 positions shown · IV contrast (15 ml prohance)
Comparison: MRI cervical spine 07/20/22

Images Obtained from [facility]
REASON FOR EXAM: 26 years-old Female with Cervicalgia, Localized swelling, mass and lump, neck.
TECHNIQUE: Multiplanar, multisequence MRI of the neck was performed without and with intravenous contrast. The patient received an intravenous dose of 15 mL ProHance.

[Series 2: t2_sag · sagittal · 5.0mm · 1.09mm/px · 3 of 27 slices shown]
[im 1/27]
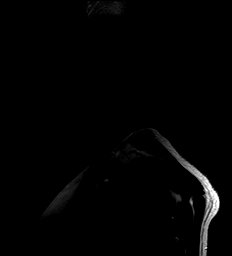
[im 14/27]
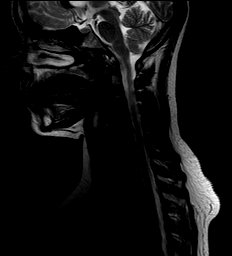
[im 27/27]
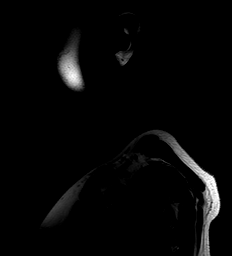

[Series 3: t1_cor · coronal · 5.0mm · 1.09mm/px · 4 of 24 slices shown]
[im 1/24]
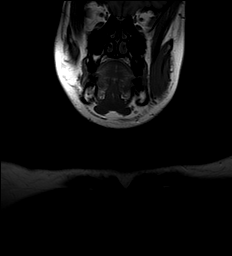
[im 8/24]
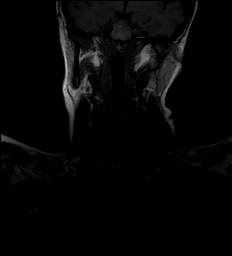
[im 16/24]
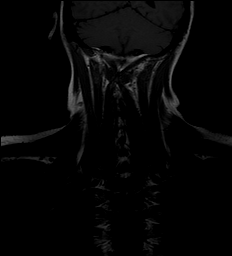
[im 24/24]
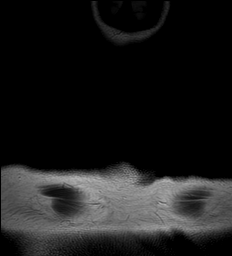

[Series 4: ir_cor · coronal · 5.0mm · 0.55mm/px · 4 of 24 slices shown]
[im 1/24]
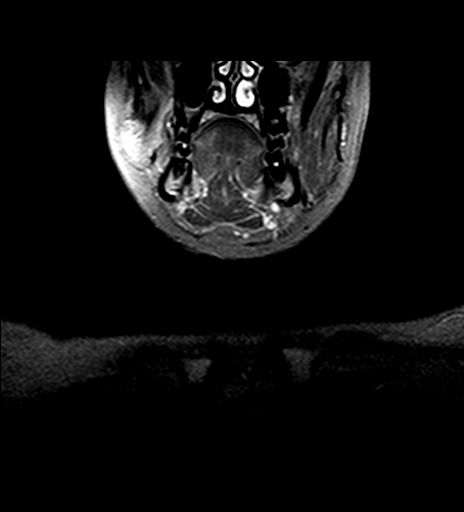
[im 8/24]
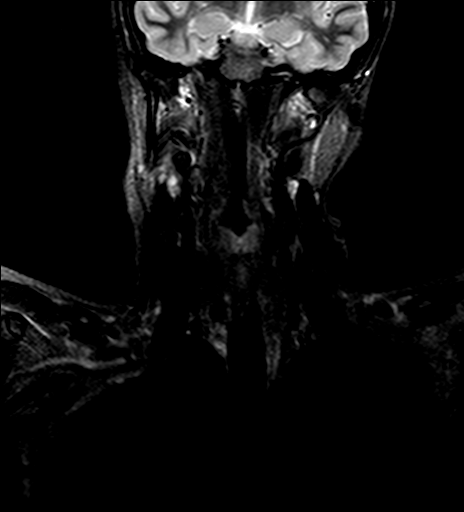
[im 16/24]
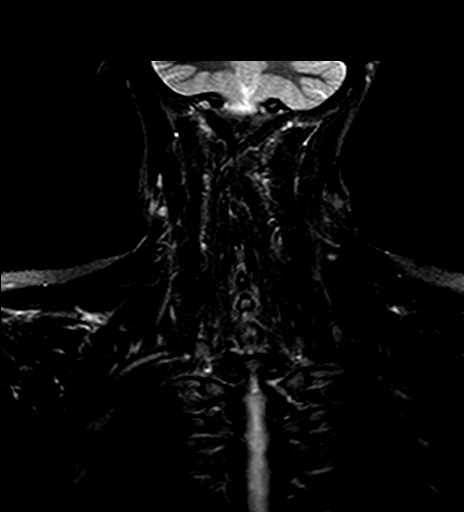
[im 24/24]
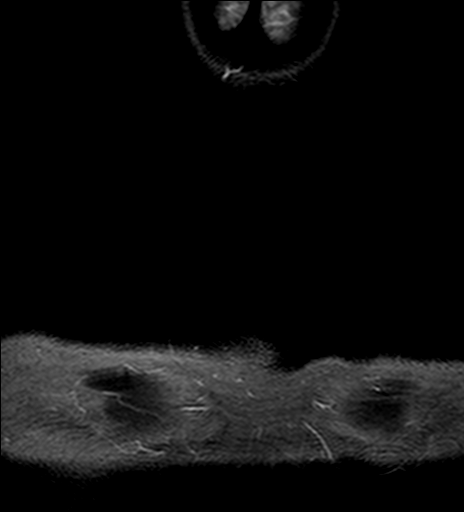

[Series 5: t1_sag · sagittal · 5.0mm · 1.09mm/px · 4 of 27 slices shown]
[im 1/27]
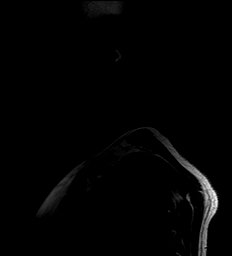
[im 9/27]
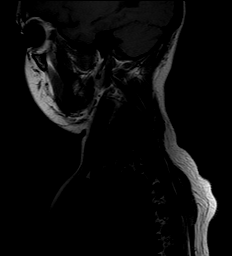
[im 18/27]
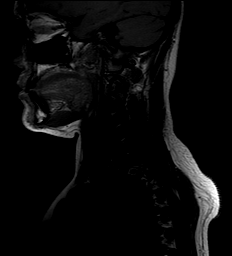
[im 27/27]
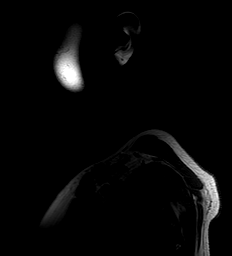

[Series 6: t1_axial · axial · 4.0mm · 0.49mm/px · z∈[-47,+96]mm · 5 of 30 slices shown]
[im 1/30]
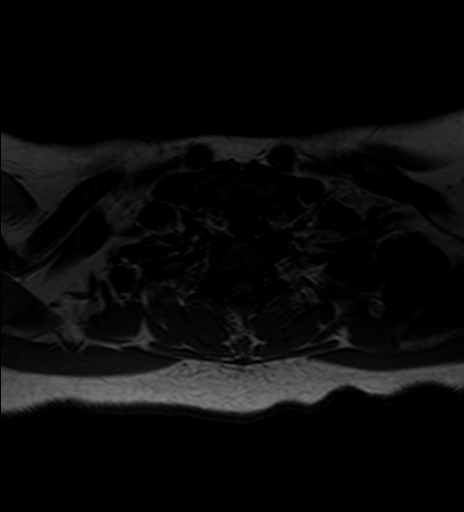
[im 8/30]
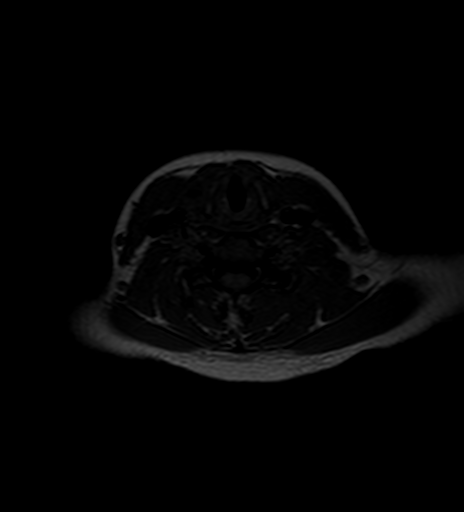
[im 15/30]
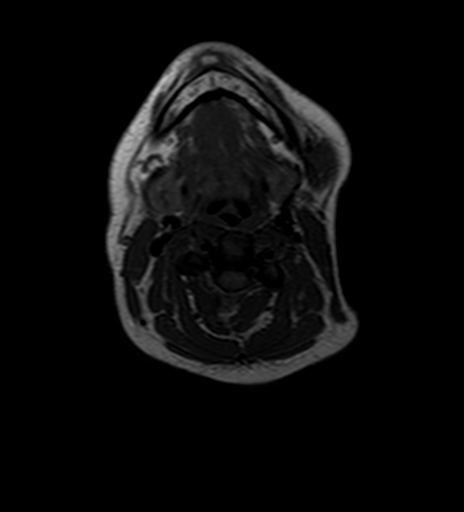
[im 22/30]
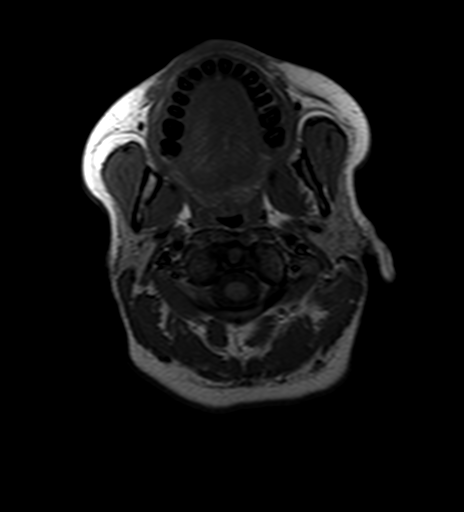
[im 30/30]
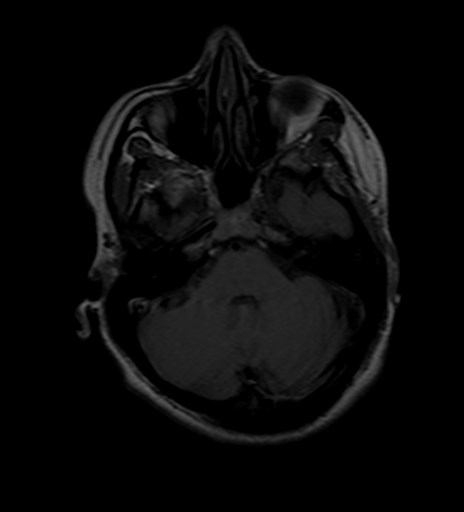

[Series 7: ir_axial · axial · 4.0mm · 0.49mm/px · z∈[-47,+96]mm · 5 of 30 slices shown]
[im 1/30]
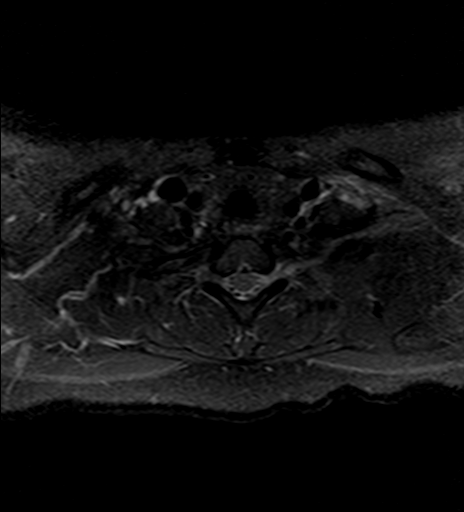
[im 8/30]
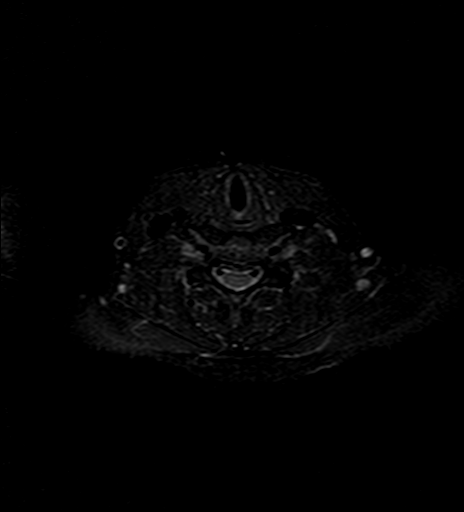
[im 15/30]
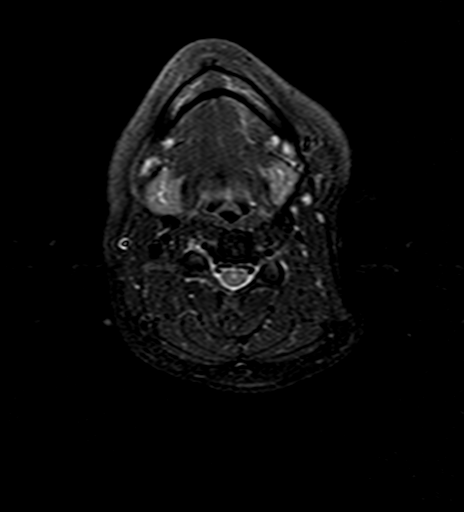
[im 22/30]
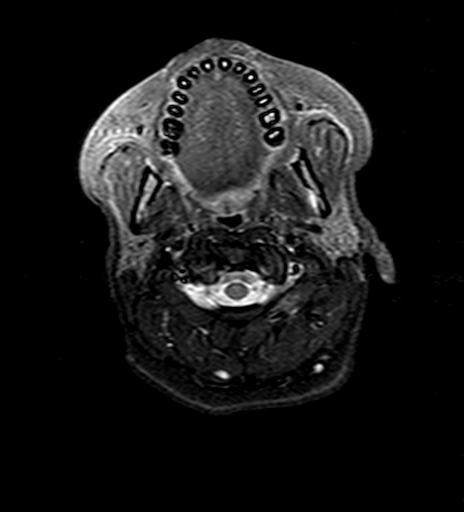
[im 30/30]
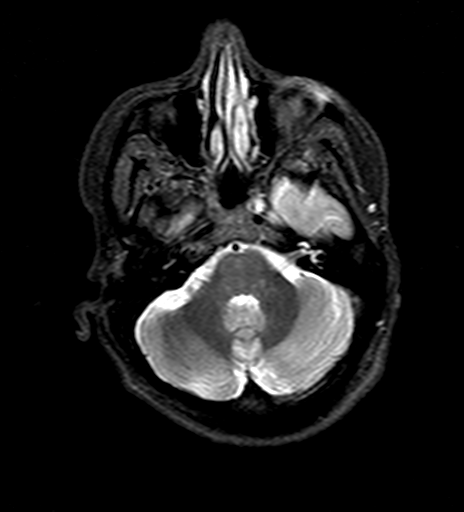

[Series 8: t1_axial_fs · axial · 4.0mm · 0.98mm/px · z∈[-47,+96]mm · 5 of 30 slices shown]
[im 1/30]
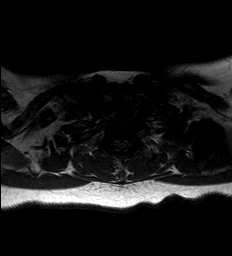
[im 8/30]
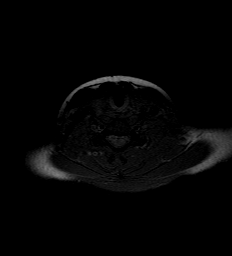
[im 15/30]
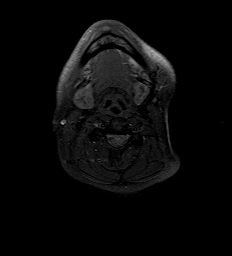
[im 22/30]
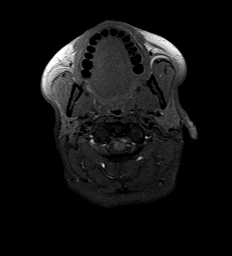
[im 30/30]
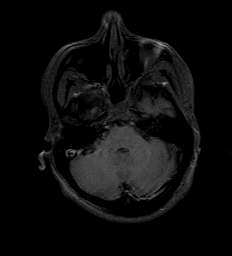

[Series 9: t1_axial_fs +c · axial · 4.0mm · 0.98mm/px · z∈[-47,+96]mm · 5 of 30 slices shown]
[im 1/30]
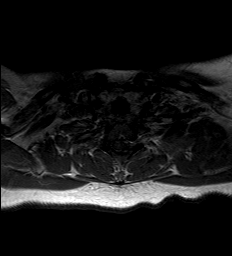
[im 8/30]
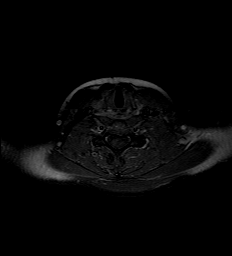
[im 15/30]
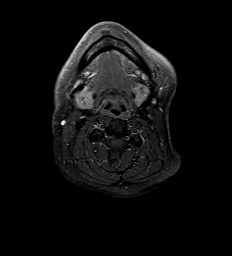
[im 22/30]
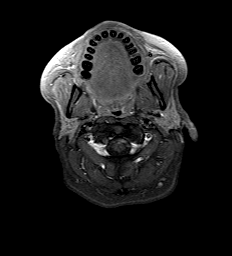
[im 30/30]
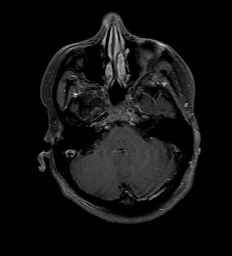

[Series 10: t1_sag_fs + c · sagittal · 5.0mm · 1.09mm/px · 2 of 27 slices shown]
[im 1/27]
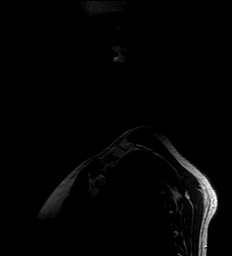
[im 9/27]
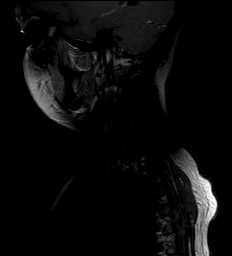

[37 of 48 positions shown; findings below may reference images not displayed]

FINDINGS: Brain/orbits: No gross parenchymal abnormality.
Paranasal sinuses/mastoid air cells: No significant mucosal thickening.
Mucosal space: No mass.
Masticator space: No asymmetric muscle atrophy or enlargement.
Parapharyngeal/retropharyngeal space: Clear.
Salivary glands: Parotid, submandibular and sublingual glands unremarkable.
Thyroid: No mass.
Lymph nodes: No lymphadenopathy.
Upper thorax: No significant abnormality.
Osseous structures: No suspicious lesion or acute fracture.
Soft tissues: Similar size of heterogeneous T2 hyperintense lesion in the superficial right paraspinal muscle fibers at the base of the neck measuring up to 15 mm. Interval decrease contrast
enhancement on today's exam may be related to differences in imaging technique or reduced vascularity of the lesion.
IMPRESSION: Unchanged size of lesion in the superficial right paraspinal muscle fibers at the base of the neck.
Interval decrease in enhancement may be related to differences in imaging technique or reduced vascularity of the lesion.
Stability suggests a benign lesion such as a soft tissue venous malformation. Consider 6-12 month follow-up with CT neck with intravenous contrast to evaluate for calcification/phleboliths
